# Patient Record
Sex: Male | Born: 1960 | Race: White | Hispanic: No | Marital: Married | Smoking: Former smoker
Health system: Southern US, Community
[De-identification: ages and names within clinical notes are randomized; demographics above are authoritative.]

## PROBLEM LIST (undated history)

## (undated) DIAGNOSIS — I10 Essential (primary) hypertension: Secondary | ICD-10-CM

## (undated) DIAGNOSIS — E079 Disorder of thyroid, unspecified: Secondary | ICD-10-CM

## (undated) HISTORY — PX: CHOLECYSTECTOMY: SHX55

## (undated) HISTORY — PX: APPENDECTOMY: SHX54

## (undated) HISTORY — PX: TONSILLECTOMY: SUR1361

---

## 2020-07-19 DIAGNOSIS — R0602 Shortness of breath: Secondary | ICD-10-CM | POA: Insufficient documentation

## 2021-04-12 ENCOUNTER — Ambulatory Visit
Admission: RE | Admit: 2021-04-12 | Discharge: 2021-04-12 | Disposition: A | Discharge status: Home / Self Care | Payer: 59 | Source: Ambulatory Visit

## 2021-04-12 ENCOUNTER — Other Ambulatory Visit: Payer: Self-pay

## 2021-04-12 VITALS — BP 152/97 | HR 84 | Temp 97.6°F | Resp 18

## 2021-04-12 DIAGNOSIS — I10 Essential (primary) hypertension: Secondary | ICD-10-CM

## 2021-04-12 DIAGNOSIS — R059 Cough, unspecified: Secondary | ICD-10-CM | POA: Diagnosis not present

## 2021-04-12 DIAGNOSIS — R0602 Shortness of breath: Secondary | ICD-10-CM

## 2021-04-12 HISTORY — DX: Essential (primary) hypertension: I10

## 2021-04-12 HISTORY — DX: Disorder of thyroid, unspecified: E07.9

## 2021-04-12 MED ORDER — PROMETHAZINE-DM 6.25-15 MG/5ML PO SYRP: 5.0000 mL | ORAL_SOLUTION | Freq: Every evening | ORAL | 0 refills | Status: DC | PRN

## 2021-04-12 MED ORDER — BENZONATATE 100 MG PO CAPS: 100.0000 mg | ORAL_CAPSULE | Freq: Three times a day (TID) | ORAL | 0 refills | Status: DC | PRN

## 2021-04-12 NOTE — ED Triage Notes (Signed)
Pt presents with cough, chest congestion and SOB x

## 2021-04-12 NOTE — Discharge Instructions (Addendum)
Take the cough medications as directed.  Follow up with your new primary care provider.   Go to the emergency department if you have acute shortness of breath or other concerning symptoms.    Your blood pressure is elevated today at 152/97.  Please have this rechecked by your primary care provider.

## 2021-04-12 NOTE — ED Provider Notes (Signed)
[facility]    CSN: [id number] Arrival date & time: 04/12/21  1151      History   Chief Complaint Chief Complaint  Patient presents with   Cough   Shortness of Breath    HPI Jaime Shepherd is a 61 y.o. male.  Patient presents with nonproductive cough and shortness of breath for several months.  The cough is worse at night and is keeping him awake.  The shortness of breath is worse with exertion.  He denies chest pain, weakness, fever, chills, ear pain, sore throat, or other symptoms.  He had a telehealth visit 2 months ago and was treated with prednisone.  He has also used an albuterol inhaler that he bought over-the-counter in [place].  Patient recently moved here from [place]. Just before moving here, he was started on an ACE-inhibitor for his blood pressure; this medication is not available in the [place] so we are unable to list it in his medication record.  He has an appointment to establish a PCP in 2 weeks.  Former smoker; quit 5 months ago.   The history is provided by the patient and the spouse.   Past Medical History:  Diagnosis Date   Hypertension    Thyroid disease     There are no problems to display for this patient.   Past Surgical History:  Procedure Laterality Date   APPENDECTOMY     CHOLECYSTECTOMY     TONSILLECTOMY         Home Medications    Prior to Admission medications   Medication Sig Start Date End Date Taking? Authorizing Provider  benzonatate (TESSALON) 100 MG capsule Take 1 capsule (100 mg total) by mouth 3 (three) times daily as needed for cough. 04/12/21  Yes Mickie Bailate, Felecia Stanfill H, NP  levothyroxine (SYNTHROID) 100 MCG tablet Take 100 mcg by mouth daily before breakfast.   Yes [provider]  promethazine-dextromethorphan (PROMETHAZINE-DM) 6.25-15 MG/5ML syrup Take 5 mLs by mouth at bedtime as needed for cough. 04/12/21  Yes Mickie Bailate, Aulden Calise H, NP    Family History History reviewed. No pertinent family history.  Social  History Social History   Tobacco Use   Smoking status: Former    Types: Cigarettes   Smokeless tobacco: Never  Vaping Use   Vaping Use: Never used  Substance Use Topics   Alcohol use: Not Currently   Drug use: Not Currently     Allergies   Patient has no allergy information on record.   Review of Systems Review of Systems  Constitutional:  Negative for chills and fever.  HENT:  Negative for ear pain and sore throat.   Respiratory:  Positive for cough and shortness of breath.   Cardiovascular:  Negative for chest pain and palpitations.  All other systems reviewed and are negative.   Physical Exam Triage Vital Signs ED Triage Vitals  Enc Vitals Group     BP      Pulse      Resp      Temp      Temp src      SpO2      Weight      Height      Head Circumference      Peak Flow      Pain Score      Pain Loc      Pain Edu?      Excl. in GC?    No data found.  Updated Vital Signs BP (!) 152/97 (BP  Location: Left Arm)    Pulse 84    Temp 97.6 F (36.4 C) (Oral)    Resp 18    SpO2 94%   Visual Acuity Right Eye Distance:   Left Eye Distance:   Bilateral Distance:    Right Eye Near:   Left Eye Near:    Bilateral Near:     Physical Exam Vitals and nursing note reviewed.  Constitutional:      General: He is not in acute distress.    Appearance: Normal appearance. He is well-developed. He is not ill-appearing.  HENT:     Right Ear: Tympanic membrane normal.     Left Ear: Tympanic membrane normal.     Nose: Nose normal.     Mouth/Throat:     Mouth: Mucous membranes are moist.     Pharynx: Oropharynx is clear.  Cardiovascular:     Rate and Rhythm: Normal rate and regular rhythm.     Heart sounds: Normal heart sounds.  Pulmonary:     Effort: Pulmonary effort is normal. No respiratory distress.     Breath sounds: Normal breath sounds. No wheezing or rhonchi.  Musculoskeletal:     Cervical back: Neck supple.     Right lower leg: No edema.     Left lower  leg: No edema.  Skin:    General: Skin is warm and dry.  Neurological:     Mental Status: He is alert.  Psychiatric:        Mood and Affect: Mood normal.        Behavior: Behavior normal.     UC Treatments / Results  Labs (all labs ordered are listed, but only abnormal results are displayed) Labs Reviewed - No data to display  EKG   Radiology No results found.  Procedures Procedures (including critical care time)  Medications Ordered in UC Medications - No data to display  Initial Impression / Assessment and Plan / UC Course  I have reviewed the triage vital signs and the nursing notes.  Pertinent labs & imaging results that were available during my care of the patient were reviewed by me and considered in my medical decision making (see chart for details).    Cough, shortness of breath, elevated blood pressure with HTN.   Former smoker; quit 5 months ago.  He was recently started on ACE for his blood pressure (medication prescribed in [place]).  He has an appointment to establish a PCP in 2 weeks.  He is in no respiratory distress now. Lungs are clear. O2 sat 94% on room air.  Treating cough with Tessalon Perles and promethazine-DM.  Discussed precautions for drowsiness with promethazine-DM.  ED precautions discussed.  Instructed him to follow up with his new PCP as scheduled in 2 weeks.  He agrees to plan of care.    Final Clinical Impressions(s) / UC Diagnoses   Final diagnoses:  Cough, unspecified type  Shortness of breath  Elevated blood pressure reading in office with diagnosis of hypertension     Discharge Instructions      Take the cough medications as directed.  Follow up with your new primary care provider.   Go to the emergency department if you have acute shortness of breath or other concerning symptoms.    Your blood pressure is elevated today at 152/97.  Please have this rechecked by your primary care provider.          ED Prescriptions      Medication Sig Dispense Auth. Provider  promethazine-dextromethorphan (PROMETHAZINE-DM) 6.25-15 MG/5ML syrup Take 5 mLs by mouth at bedtime as needed for cough. 60 mL Mickie Bail, NP   benzonatate (TESSALON) 100 MG capsule Take 1 capsule (100 mg total) by mouth 3 (three) times daily as needed for cough. 21 capsule Mickie Bail, NP      PDMP not reviewed this encounter.   Mickie Bail, NP 04/12/21 1250

## 2021-05-10 ENCOUNTER — Ambulatory Visit
Admission: RE | Admit: 2021-05-10 | Discharge: 2021-05-10 | Disposition: A | Discharge status: Home / Self Care | Payer: 59 | Source: Ambulatory Visit | Attending: Physician Assistant | Admitting: Physician Assistant

## 2021-05-10 ENCOUNTER — Other Ambulatory Visit: Payer: Self-pay

## 2021-05-10 VITALS — BP 129/85 | HR 93 | Temp 98.8°F | Resp 18

## 2021-05-10 DIAGNOSIS — R053 Chronic cough: Secondary | ICD-10-CM

## 2021-05-10 MED ORDER — PROMETHAZINE-DM 6.25-15 MG/5ML PO SYRP: 5.0000 mL | ORAL_SOLUTION | Freq: Every evening | ORAL | 0 refills | Status: DC | PRN

## 2021-05-10 MED ORDER — BENZONATATE 100 MG PO CAPS: 100.0000 mg | ORAL_CAPSULE | Freq: Three times a day (TID) | ORAL | 0 refills | Status: DC | PRN

## 2021-05-10 NOTE — ED Triage Notes (Signed)
Pt presents with cough x 4-5 days.  ?

## 2021-05-10 NOTE — Discharge Instructions (Signed)
I refilled your Tessalon to be used during the day as well as promethazine DM for cough at night.  This can make you sleepy so do not drive or drink alcohol with it.  Continue antihistamine for cough and allergy symptoms.  Make sure you are drinking plenty fluid.  It is important follow-up with specialist as we discussed for additional testing.  If anything worsens and you develop high fever, worsening cough, chest pain, shortness of breath you need to be seen immediately. ?

## 2021-05-10 NOTE — ED Provider Notes (Signed)
?[facility] ? ? ? ?CSN: [id number] ?Arrival date & time: 05/10/21  1311 ? ? ?  ? ?History   ?Chief Complaint ?Chief Complaint  ?Patient presents with  ? Cough  ? ? ?HPI ?Jaime Shepherd is a 61 y.o. male.  ? ?Patient presents today with a several month history of persistent cough that varies in intensity.  He was seen by our clinic with similar symptoms approximately 1 month ago which point he was prescribed Tessalon and Promethazine DM.  Reports this was very effective and help to manage his symptoms.  He has run out of these medications with worsening cough particularly at night.  States that this makes it so he is unable to sleep and will often have associated gagging/nausea.  He denies any fever, nasal congestion, nausea, vomiting.  Denies formal diagnosis of asthma or COPD.  He has tried inhaler without improvement of symptoms.  He is a former smoker but quit several months ago.  He has tried allergy medication without improvement.  Denies any GERD symptoms.  He has not tried taking ACE inhibitor.  He has seen his primary care provider and is scheduled to undergo spirometry and other testing but was hoping to have a refill of this medication until he is seen by specialist. ? ? ?Past Medical History:  ?Diagnosis Date  ? Hypertension   ? Thyroid disease   ? ? ?There are no problems to display for this patient. ? ? ?Past Surgical History:  ?Procedure Laterality Date  ? APPENDECTOMY    ? CHOLECYSTECTOMY    ? TONSILLECTOMY    ? ? ? ? ? ?Home Medications   ? ?Prior to Admission medications   ?Medication Sig Start Date End Date Taking? Authorizing Provider  ?levothyroxine (SYNTHROID) 100 MCG tablet Take 100 mcg by mouth daily before breakfast.   Yes [provider]  ?benzonatate (TESSALON) 100 MG capsule Take 1 capsule (100 mg total) by mouth 3 (three) times daily as needed for cough. 05/10/21   Maciah Schweigert, Noberto Retort, PA-C  ?promethazine-dextromethorphan (PROMETHAZINE-DM) 6.25-15 MG/5ML syrup Take 5 mLs by  mouth at bedtime as needed for cough. 05/10/21   Janilah Hojnacki, Noberto Retort, PA-C  ? ? ?Family History ?History reviewed. No pertinent family history. ? ?Social History ?Social History  ? ?Tobacco Use  ? Smoking status: Former  ?  Types: Cigarettes  ? Smokeless tobacco: Never  ?Vaping Use  ? Vaping Use: Never used  ?Substance Use Topics  ? Alcohol use: Not Currently  ? Drug use: Not Currently  ? ? ? ?Allergies   ?Patient has no known allergies. ? ? ?Review of Systems ?Review of Systems  ?Constitutional:  Positive for activity change. Negative for appetite change, fatigue and fever.  ?HENT:  Negative for congestion, sinus pressure, sneezing and sore throat.   ?Respiratory:  Positive for cough and shortness of breath.   ?Cardiovascular:  Negative for chest pain.  ?Gastrointestinal:  Negative for abdominal pain, diarrhea, nausea and vomiting.  ?Neurological:  Negative for dizziness, light-headedness and headaches.  ?Psychiatric/Behavioral:  Positive for sleep disturbance.   ? ? ?Physical Exam ?Triage Vital Signs ?ED Triage Vitals [05/10/21 1333]  ?Enc Vitals Group  ?   BP 129/85  ?   Pulse Rate 93  ?   Resp 18  ?   Temp 98.8 ?F (37.1 ?C)  ?   Temp Source Oral  ?   SpO2 94 %  ?   Weight   ?   Height   ?  Head Circumference   ?   Peak Flow   ?   Pain Score   ?   Pain Loc   ?   Pain Edu?   ?   Excl. in GC?   ? ?No data found. ? ?Updated Vital Signs ?BP 129/85 (BP Location: Left Arm)   Pulse 93   Temp 98.8 ?F (37.1 ?C) (Oral)   Resp 18   SpO2 94%  ? ?Visual Acuity ?Right Eye Distance:   ?Left Eye Distance:   ?Bilateral Distance:   ? ?Right Eye Near:   ?Left Eye Near:    ?Bilateral Near:    ? ?Physical Exam ?Vitals reviewed.  ?Constitutional:   ?   General: He is awake.  ?   Appearance: Normal appearance. He is well-developed. He is not ill-appearing.  ?   Comments: Very pleasant male appears stated age in no acute distress sitting comfortably in exam room  ?HENT:  ?   Head: Normocephalic and atraumatic.  ?   Right Ear: External ear  normal.  ?   Left Ear: External ear normal.  ?   Nose: Nose normal.  ?   Mouth/Throat:  ?   Pharynx: Uvula midline. No oropharyngeal exudate or posterior oropharyngeal erythema.  ?Cardiovascular:  ?   Rate and Rhythm: Normal rate and regular rhythm.  ?   Heart sounds: Normal heart sounds, S1 normal and S2 normal. No murmur heard. ?Pulmonary:  ?   Effort: Pulmonary effort is normal. No accessory muscle usage or respiratory distress.  ?   Breath sounds: Normal breath sounds. No stridor. No wheezing, rhonchi or rales.  ?   Comments: Clear to auscultation bilaterally ?Abdominal:  ?   General: Bowel sounds are normal.  ?   Palpations: Abdomen is soft.  ?   Tenderness: There is no abdominal tenderness.  ?Neurological:  ?   Mental Status: He is alert.  ?Psychiatric:     ?   Behavior: Behavior is cooperative.  ? ? ? ?UC Treatments / Results  ?Labs ?(all labs ordered are listed, but only abnormal results are displayed) ?Labs Reviewed - No data to display ? ?EKG ? ? ?Radiology ?No results found. ? ?Procedures ?Procedures (including critical care time) ? ?Medications Ordered in UC ?Medications - No data to display ? ?Initial Impression / Assessment and Plan / UC Course  ?I have reviewed the triage vital signs and the nursing notes. ? ?Pertinent labs & imaging results that were available during my care of the patient were reviewed by me and considered in my medical decision making (see chart for details). ? ?  ? ?Vital signs and physical exam reassuring today; no indication for emergent evaluation or imaging.  No indication for viral testing given patient has been symptomatic for several months.  Refill of Tessalon and Promethazine DM to manage symptoms.  He was encouraged to continue daily antihistamine as well as acid reflux medication if he develops any indigestion.  Discussed with and utility of chest x-ray but given patient's lungs are clear and he is scheduled for specialist testing in the near future this was declined.   Discussed the importance of following up with specialist for further evaluation and management.  Discussed that if symptoms worsen anyway and he develops worsening cough, high fever, chest pain, shortness of breath, weakness he needs to be seen immediately.  Strict return precautions given to which she expressed understanding. ? ?Final Clinical Impressions(s) / UC Diagnoses  ? ?Final diagnoses:  ?Chronic cough  ? ? ? ?  Discharge Instructions   ? ?  ?I refilled your Tessalon to be used during the day as well as promethazine DM for cough at night.  This can make you sleepy so do not drive or drink alcohol with it.  Continue antihistamine for cough and allergy symptoms.  Make sure you are drinking plenty fluid.  It is important follow-up with specialist as we discussed for additional testing.  If anything worsens and you develop high fever, worsening cough, chest pain, shortness of breath you need to be seen immediately. ? ? ? ? ?ED Prescriptions   ? ? Medication Sig Dispense Auth. Provider  ? benzonatate (TESSALON) 100 MG capsule Take 1 capsule (100 mg total) by mouth 3 (three) times daily as needed for cough. 21 capsule Arieonna Medine K, PA-C  ? promethazine-dextromethorphan (PROMETHAZINE-DM) 6.25-15 MG/5ML syrup Take 5 mLs by mouth at bedtime as needed for cough. 118 mL Antonios Ostrow K, PA-C  ? ?  ? ?PDMP not reviewed this encounter. ?  ?Jeani Hawking, PA-C ?05/10/21 1355 ? ?

## 2021-06-05 NOTE — Progress Notes (Signed)
? ?New Patient Office Visit ? ?Subjective:  ?Patient ID: Jaime Shepherd, male    DOB: [birth year]  Age: 61 y.o. MRN: [id number] ? ?CC:  ?Chief Complaint  ?Patient presents with  ? New Patient (Initial Visit)  ? ? ?HPI ?Jaime Shepherd presents to establish. Patient relocated from [place] a few months ago. Patient has a past medical history of hypothyroidism and hypertension. Currently on Idaprex Combi 4/1.25 mg for high blood pressure. Patient reports medication was changed to include a diuretic due to problems with fluid retention which has been stable since being on medication. Previously was just on perindopril which he had been on for several years before it was changed. Takes Levothyroxine 100 mcg which he has been on >5 years. Patient reports having shortness of breath which has progressively worsened, gets short of breath even with taking trash can bins out. Also having a chronic cough. Went to urgent care for evaluation and cough syrup at bedtime has helped. Reports tried Claritin which was ineffective. Has an appointment with pulmonology 06/22/21. No chest pain, jaw or arm pain, dizziness or syncope. Does report some shortness of breath at night when laying down. When feeling short of breath sometimes feels like his heart is racing, does not happen all the time. Denies prior hx of hyperlipidemia or heart diease.  ? ? ?Past Medical History:  ?Diagnosis Date  ? Hypertension   ? Thyroid disease   ? ? ?Past Surgical History:  ?Procedure Laterality Date  ? APPENDECTOMY    ? CHOLECYSTECTOMY    ? TONSILLECTOMY    ? ? ?History reviewed. No pertinent family history. ? ?Social History  ? ?Socioeconomic History  ? Marital status: Married  ?  Spouse name: Not on file  ? Number of children: Not on file  ? Years of education: Not on file  ? Highest education level: Not on file  ?Occupational History  ? Not on file  ?Tobacco Use  ? Smoking status: Former  ?  Packs/day: 0.50  ?  Years: 7.00  ?  Pack years: 3.50  ?   Types: Cigarettes  ?  Quit date: 2022  ?  Years since quitting: 1.2  ? Smokeless tobacco: Never  ?Vaping Use  ? Vaping Use: Never used  ?Substance and Sexual Activity  ? Alcohol use: Not Currently  ? Drug use: Not Currently  ? Sexual activity: Yes  ?Other Topics Concern  ? Not on file  ?Social History Narrative  ? Not on file  ? ?Social Determinants of Health  ? ?Financial Resource Strain: Not on file  ?Food Insecurity: Not on file  ?Transportation Needs: Not on file  ?Physical Activity: Not on file  ?Stress: Not on file  ?Social Connections: Not on file  ?Intimate Partner Violence: Not on file  ? ? ?ROS ?Review of Systems ?Review of Systems:  ?A fourteen system review of systems was performed and found to be positive as per HPI. ? ?Objective:  ? ?Today's Vitals: BP 124/81   Pulse 70   Temp 98.2 ?F (36.8 ?C)   Ht 6\' 2"  (1.88 m)   Wt 261 lb (118.4 kg)   SpO2 94%   BMI 33.51 kg/m?  ? ?Physical Exam ?General:  Well Developed, well nourished, appropriate for stated age.  ?Neuro:  Alert and oriented,  extra-ocular muscles intact  ?HEENT:  Normocephalic, atraumatic, neck supple  ?Skin:  no gross rash, warm, pink. ?Cardiac:  RRR, S1 S2 ?Respiratory: CTA B/L  ?Vascular:  Ext warm, no  cyanosis apprec.; cap RF less 2 sec. ?Psych:  No HI/SI, judgement and insight good, Euthymic mood. Full Affect. ? ?Assessment & Plan:  ? ?Problem List Items Addressed This Visit   ? ?  ? Cardiovascular and Mediastinum  ? Primary hypertension - Primary  ?  -BP and pulse in office optimal. Patient relocated from [place] no have no records to review. Discussed with patient to complete Idaprex Combi 4/1.25 mg and then start Lisinopril-HCTZ 10-12.5 mg. Patient has a chronic cough which could possibly be a side effect of medication therapy with Acei so will consider changing anti-hypertensive if pulmonology work-up negative for underlying pulmonary condition. Patient is former smoker with 3.5 pack yr hx. Will collect CMP to obtin baseline for  renal function and electrolytes. Will continue to monitor. ?  ?  ? Relevant Medications  ? INDAPAMIDE PO  ? lisinopril-hydrochlorothiazide (ZESTORETIC) 10-12.5 MG tablet  ?  ? Endocrine  ? Hypothyroidism  ?  -Recommend to schedule lab visit and will collect TSH. Continue current medication regimen. Pending lab results will adjust treatment plan if indicated. Will continue to monitor. ?  ?  ? Relevant Medications  ? levothyroxine (SYNTHROID) 100 MCG tablet  ?  ? Other  ? Shortness of breath  ?  -Reviewed urgent care notes. No labs have been obtained. Advised patient to schedule lab visit for fasting blood-work to evaluate for cardiovascular risk factors, endocrine or metabolic etiology contributing to symptoms. Will also obtain BNP and place order for echocardiogram to evaluate for possible cardiac etiology. Provided refill of tessalon Perles and Bromfed. Scheduled with [facility] 06/22/21.  ?  ?  ? Relevant Orders  ? ECHOCARDIOGRAM COMPLETE  ? ?Other Visit Diagnoses   ? ? Encounter to establish care      ? ?  ? ? ?Outpatient Encounter Medications as of 06/06/2021  ?Medication Sig  ? INDAPAMIDE PO 1.25 mg.  ? lisinopril-hydrochlorothiazide (ZESTORETIC) 10-12.5 MG tablet Take 1 tablet by mouth daily.  ? [DISCONTINUED] Perindopril Arg-amLODIPine 7-5 MG TABS   ? benzonatate (TESSALON) 100 MG capsule Take 1 capsule (100 mg total) by mouth 3 (three) times daily as needed for cough.  ? levothyroxine (SYNTHROID) 100 MCG tablet Take 1 tablet (100 mcg total) by mouth daily before breakfast.  ? promethazine-dextromethorphan (PROMETHAZINE-DM) 6.25-15 MG/5ML syrup Take 5 mLs by mouth at bedtime as needed for cough.  ? [DISCONTINUED] benzonatate (TESSALON) 100 MG capsule Take 1 capsule (100 mg total) by mouth 3 (three) times daily as needed for cough.  ? [DISCONTINUED] levothyroxine (SYNTHROID) 100 MCG tablet Take 100 mcg by mouth daily before breakfast.  ? [DISCONTINUED] promethazine-dextromethorphan (PROMETHAZINE-DM) 6.25-15 MG/5ML  syrup Take 5 mLs by mouth at bedtime as needed for cough.  ? ?No facility-administered encounter medications on file as of 06/06/2021.  ? ? ?Follow-up: Return in about 8 weeks (around 08/01/2021) for HTN, thyroid, dyspnea; lab visit this week or next for FBW (include BNP) .  ? ?Lorrene Reid, PA-C ? ?

## 2021-06-06 ENCOUNTER — Ambulatory Visit (INDEPENDENT_AMBULATORY_CARE_PROVIDER_SITE_OTHER): Payer: 59 | Admitting: Physician Assistant

## 2021-06-06 ENCOUNTER — Encounter: Payer: Self-pay | Admitting: Physician Assistant

## 2021-06-06 ENCOUNTER — Other Ambulatory Visit: Payer: Self-pay

## 2021-06-06 VITALS — BP 124/81 | HR 70 | Temp 98.2°F | Ht 74.0 in | Wt 261.0 lb

## 2021-06-06 DIAGNOSIS — E0590 Thyrotoxicosis, unspecified without thyrotoxic crisis or storm: Secondary | ICD-10-CM | POA: Insufficient documentation

## 2021-06-06 DIAGNOSIS — I10 Essential (primary) hypertension: Secondary | ICD-10-CM | POA: Diagnosis not present

## 2021-06-06 DIAGNOSIS — Z7689 Persons encountering health services in other specified circumstances: Secondary | ICD-10-CM

## 2021-06-06 DIAGNOSIS — E039 Hypothyroidism, unspecified: Secondary | ICD-10-CM | POA: Diagnosis not present

## 2021-06-06 DIAGNOSIS — R0602 Shortness of breath: Secondary | ICD-10-CM | POA: Diagnosis not present

## 2021-06-06 MED ORDER — BENZONATATE 100 MG PO CAPS: 100.0000 mg | ORAL_CAPSULE | Freq: Three times a day (TID) | ORAL | 0 refills | Status: DC | PRN

## 2021-06-06 MED ORDER — LISINOPRIL-HYDROCHLOROTHIAZIDE 10-12.5 MG PO TABS: 1.0000 | ORAL_TABLET | Freq: Every day | ORAL | 0 refills | Status: DC

## 2021-06-06 MED ORDER — LEVOTHYROXINE SODIUM 100 MCG PO TABS: 100.0000 ug | ORAL_TABLET | Freq: Every day | ORAL | 1 refills | Status: AC

## 2021-06-06 MED ORDER — PROMETHAZINE-DM 6.25-15 MG/5ML PO SYRP: 5.0000 mL | ORAL_SOLUTION | Freq: Every evening | ORAL | 0 refills | Status: DC | PRN

## 2021-06-06 NOTE — Assessment & Plan Note (Signed)
-  Recommend to schedule lab visit and will collect TSH. Continue current medication regimen. Pending lab results will adjust treatment plan if indicated. Will continue to monitor. ?

## 2021-06-06 NOTE — Patient Instructions (Signed)
Shortness of Breath, Adult Shortness of breath is when a person has trouble breathing or when a person feels like she or he is having trouble breathing in enough air. Shortness of breath could be a sign of a medical problem. Follow these instructions at home: Pollutants Do not use any products that contain nicotine or tobacco. These products include cigarettes, chewing tobacco, and vaping devices, such as e-cigarettes. This also includes cigars and pipes. If you need help quitting, ask your health care provider. Avoid things that can irritate your airways, including: Smoke. This includes campfire smoke, forest fire smoke, and secondhand smoke from tobacco products. Do not smoke or allow others to smoke in your home. Mold. Dust. Air pollution. Chemical fumes. Things that can give you an allergic reaction (allergens) if you have allergies. Common allergens include pollen from grasses or trees and animal dander. Keep your living space clean and free of mold and dust. General instructions Pay attention to any changes in your symptoms. Take over-the-counter and prescription medicines only as told by your health care provider. This includes oxygen therapy and inhaled medicines. Rest as needed. Return to your normal activities as told by your health care provider. Ask your health care provider what activities are safe for you. Keep all follow-up visits. This is important. Contact a health care provider if: Your condition does not improve as soon as expected. You have a hard time doing your normal activities, even after you rest. You have new symptoms. You cannot walk up stairs or exercise the way that you normally do. Get help right away if: Your shortness of breath gets worse. You have shortness of breath when you are resting. You feel light-headed or you faint. You have a cough that is not controlled with medicines. You cough up blood. You have pain with breathing. You have pain in your  chest, arms, shoulders, or abdomen. You have a fever. These symptoms may be an emergency. Get help right away. Call 911. Do not wait to see if the symptoms will go away. Do not drive yourself to the hospital. Summary Shortness of breath is when a person has trouble breathing enough air. It can be a sign of a medical problem. Avoid things that irritate your lungs, such as smoking, pollution, mold, and dust. Pay attention to changes in your symptoms and contact your health care provider if you have a hard time completing daily activities because of shortness of breath. This information is not intended to replace advice given to you by your health care provider. Make sure you discuss any questions you have with your health care provider. Document Revised: 10/15/2020 Document Reviewed: 10/15/2020 Elsevier Patient Education  2022 [employer]  

## 2021-06-06 NOTE — Assessment & Plan Note (Addendum)
-  BP and pulse in office optimal. Patient relocated from [place] no have no records to review. Discussed with patient to complete Idaprex Combi 4/1.25 mg and then start Lisinopril-HCTZ 10-12.5 mg. Patient has a chronic cough which could possibly be a side effect of medication therapy with Acei so will consider changing anti-hypertensive if pulmonology work-up negative for underlying pulmonary condition. Patient is former smoker with 3.5 pack yr hx. Will collect CMP to obtin baseline for renal function and electrolytes. Will continue to monitor. ?

## 2021-06-06 NOTE — Assessment & Plan Note (Signed)
-  Reviewed urgent care notes. No labs have been obtained. Advised patient to schedule lab visit for fasting blood-work to evaluate for cardiovascular risk factors, endocrine or metabolic etiology contributing to symptoms. Will also obtain BNP and place order for echocardiogram to evaluate for possible cardiac etiology. Provided refill of tessalon Perles and Bromfed. Scheduled with [facility] 06/22/21.  ?

## 2021-06-07 ENCOUNTER — Other Ambulatory Visit: Payer: Self-pay

## 2021-06-07 DIAGNOSIS — R0602 Shortness of breath: Secondary | ICD-10-CM

## 2021-06-07 DIAGNOSIS — Z130 Encounter for screening for diseases of the blood and blood-forming organs and certain disorders involving the immune mechanism: Secondary | ICD-10-CM

## 2021-06-07 DIAGNOSIS — E039 Hypothyroidism, unspecified: Secondary | ICD-10-CM

## 2021-06-07 DIAGNOSIS — I10 Essential (primary) hypertension: Secondary | ICD-10-CM

## 2021-06-08 ENCOUNTER — Other Ambulatory Visit: Payer: 59

## 2021-06-08 DIAGNOSIS — E039 Hypothyroidism, unspecified: Secondary | ICD-10-CM

## 2021-06-08 DIAGNOSIS — Z130 Encounter for screening for diseases of the blood and blood-forming organs and certain disorders involving the immune mechanism: Secondary | ICD-10-CM

## 2021-06-08 DIAGNOSIS — R0602 Shortness of breath: Secondary | ICD-10-CM

## 2021-06-08 DIAGNOSIS — I10 Essential (primary) hypertension: Secondary | ICD-10-CM

## 2021-06-09 LAB — CBC WITH DIFFERENTIAL/PLATELET
Basophils Absolute: 0.1 10*3/uL (ref 0.0–0.2)
Basos: 1 %
EOS (ABSOLUTE): 0.3 10*3/uL (ref 0.0–0.4)
Eos: 4 %
Hematocrit: 45.5 % (ref 37.5–51.0)
Hemoglobin: 15.9 g/dL (ref 13.0–17.7)
Immature Grans (Abs): 0 10*3/uL (ref 0.0–0.1)
Immature Granulocytes: 0 %
Lymphocytes Absolute: 1.4 10*3/uL (ref 0.7–3.1)
Lymphs: 22 %
MCH: 31.7 pg (ref 26.6–33.0)
MCHC: 34.9 g/dL (ref 31.5–35.7)
MCV: 91 fL (ref 79–97)
Monocytes Absolute: 0.6 10*3/uL (ref 0.1–0.9)
Monocytes: 10 %
Neutrophils Absolute: 4 10*3/uL (ref 1.4–7.0)
Neutrophils: 63 %
Platelets: 190 10*3/uL (ref 150–450)
RBC: 5.02 x10E6/uL (ref 4.14–5.80)
RDW: 13.1 % (ref 11.6–15.4)
WBC: 6.4 10*3/uL (ref 3.4–10.8)

## 2021-06-09 LAB — COMPREHENSIVE METABOLIC PANEL
ALT: 25 IU/L (ref 0–44)
AST: 20 IU/L (ref 0–40)
Albumin/Globulin Ratio: 1.4 (ref 1.2–2.2)
Albumin: 4.2 g/dL (ref 3.8–4.8)
Alkaline Phosphatase: 106 IU/L (ref 44–121)
BUN/Creatinine Ratio: 14 (ref 10–24)
BUN: 21 mg/dL (ref 8–27)
Bilirubin Total: 0.7 mg/dL (ref 0.0–1.2)
CO2: 21 mmol/L (ref 20–29)
Calcium: 9.2 mg/dL (ref 8.6–10.2)
Chloride: 100 mmol/L (ref 96–106)
Creatinine, Ser: 1.46 mg/dL — ABNORMAL HIGH (ref 0.76–1.27)
Globulin, Total: 3 g/dL (ref 1.5–4.5)
Glucose: 102 mg/dL — ABNORMAL HIGH (ref 70–99)
Potassium: 4 mmol/L (ref 3.5–5.2)
Sodium: 140 mmol/L (ref 134–144)
Total Protein: 7.2 g/dL (ref 6.0–8.5)
eGFR: 54 mL/min/{1.73_m2} — ABNORMAL LOW (ref >59)

## 2021-06-09 LAB — BRAIN NATRIURETIC PEPTIDE: BNP: 13.3 pg/mL (ref 0.0–100.0)

## 2021-06-09 LAB — LIPID PANEL
Chol/HDL Ratio: 4.3 ratio (ref 0.0–5.0)
Cholesterol, Total: 172 mg/dL (ref 100–199)
HDL: 40 mg/dL (ref >39)
LDL Chol Calc (NIH): 109 mg/dL — ABNORMAL HIGH (ref 0–99)
Triglycerides: 128 mg/dL (ref 0–149)
VLDL Cholesterol Cal: 23 mg/dL (ref 5–40)

## 2021-06-09 LAB — HEMOGLOBIN A1C
Est. average glucose Bld gHb Est-mCnc: 114 mg/dL
Hgb A1c MFr Bld: 5.6 % (ref 4.8–5.6)

## 2021-06-09 LAB — TSH: TSH: 3.79 u[IU]/mL (ref 0.450–4.500)

## 2021-06-16 ENCOUNTER — Ambulatory Visit (HOSPITAL_COMMUNITY): Payer: 59 | Attending: Cardiology

## 2021-06-16 DIAGNOSIS — R0602 Shortness of breath: Secondary | ICD-10-CM | POA: Diagnosis present

## 2021-06-16 LAB — ECHOCARDIOGRAM COMPLETE
Area-P 1/2: 3.65 cm2
S' Lateral: 2.9 cm

## 2021-06-22 ENCOUNTER — Encounter: Payer: Self-pay | Admitting: Pulmonary Disease

## 2021-06-22 ENCOUNTER — Ambulatory Visit: Payer: 59 | Admitting: Pulmonary Disease

## 2021-06-22 ENCOUNTER — Ambulatory Visit (INDEPENDENT_AMBULATORY_CARE_PROVIDER_SITE_OTHER): Payer: 59

## 2021-06-22 VITALS — BP 118/76 | HR 86 | Temp 97.5°F | Ht 74.0 in | Wt 263.0 lb

## 2021-06-22 DIAGNOSIS — R0602 Shortness of breath: Secondary | ICD-10-CM

## 2021-06-22 MED ORDER — DOXYCYCLINE HYCLATE 100 MG PO TABS: 100.0000 mg | ORAL_TABLET | Freq: Two times a day (BID) | ORAL | 0 refills | Status: DC

## 2021-06-22 MED ORDER — ALBUTEROL SULFATE HFA 108 (90 BASE) MCG/ACT IN AERS: 2.0000 | INHALATION_SPRAY | Freq: Four times a day (QID) | RESPIRATORY_TRACT | 6 refills | Status: DC | PRN

## 2021-06-22 MED ORDER — FLUTICASONE FUROATE-VILANTEROL 200-25 MCG/ACT IN AEPB: 1.0000 | INHALATION_SPRAY | Freq: Every day | RESPIRATORY_TRACT | 2 refills | Status: DC

## 2021-06-22 NOTE — Progress Notes (Signed)
? ?      ?Jaime Shepherd    [id number]    08/14/1960 ? ?Primary Care Physician:Abonza, Kandis Cocking, PA-C ? ?Referring Physician: No referring provider defined for this encounter. ? ?Chief complaint:   ?Patient with a history of cough, shortness of breath, wheezing ? ?HPI: ? ?Concern for asthma ? ?Relocated from [place] in October 2022 ? ?Jaime Shepherd was having shortness of breath and wheezing, decreased activity tolerance from cough and shortness of breath even before his relocation ?-Was able to procure albuterol which can be obtained over-the-counter ? ?Jaime Shepherd does notice improvement in his symptoms with albuterol use ? ?Jaime Shepherd has become less active because Jaime Shepherd gets short of breath easily ? ?Activity tolerance less than 100 yards sometimes ? ?Jaime Shepherd does have hypertension, recently started on medications ?-Diastolic blood pressure was in the 100s before medications were started ? ?Recent echocardiogram shows diastolic dysfunction ? ?Jaime Shepherd is known to snore but no witnessed apneas ? ?Jaime Shepherd is short of breath with usual normal activities ? ?Cough sometimes is worse when Jaime Shepherd is laying down or sometimes in the evening ?Does not have any symptoms suggesting reflux, cold weather makes the coughing worse ? ?Reformed smoker ?-Overall smokes about 8 years ?Had about 30 years when Jaime Shepherd was not smoking at all ? 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 ?Outpatient Encounter Medications as of 06/22/2021  ?Medication Sig  ? benzonatate (TESSALON) 100 MG capsule Take 1 capsule (100 mg total) by mouth 3 (three) times daily as needed for cough.  ? fluticasone furoate-vilanterol (BREO ELLIPTA) 200-25 MCG/ACT AEPB Inhale 1 puff into the lungs daily.  ? INDAPAMIDE PO 1.25 mg.  ? levothyroxine (SYNTHROID) 100 MCG tablet Take 1 tablet (100 mcg total) by mouth daily before breakfast.  ? lisinopril-hydrochlorothiazide (ZESTORETIC) 10-12.5 MG tablet Take 1 tablet by mouth daily.  ? [DISCONTINUED] promethazine-dextromethorphan  (PROMETHAZINE-DM) 6.25-15 MG/5ML syrup Take 5 mLs by mouth at bedtime as needed for cough. (Patient not taking: Reported on 06/22/2021)  ? ?No facility-administered encounter medications on file as of 06/22/2021.  ? ? ?Allergies as of 06/22/2021  ? (No Known Allergies)  ? ? ?Past Medical History:  ?Diagnosis Date  ? Hypertension   ? Thyroid disease   ? ? ?Past Surgical History:  ?Procedure Laterality Date  ? APPENDECTOMY    ? CHOLECYSTECTOMY    ? TONSILLECTOMY    ? ? ?Family History  ?Problem Relation Age of Onset  ? Hypertension Mother   ? Melanoma Father   ? Breast cancer Neg Hx   ? Rheum arthritis Neg Hx   ? Sleep apnea Neg Hx   ? ? ?Social History  ? ?Socioeconomic History  ? Marital status: Married  ?  Spouse name: Not on file  ? Number of children: Not on file  ? Years of education: Not on file  ? Highest education level: Not on file  ?Occupational History  ? Not on file  ?Tobacco Use  ? Smoking status: Former  ?  Packs/day: 0.50  ?  Years: 7.00  ?  Pack years: 3.50  ?  Types: Cigarettes  ?  Quit date: 2022  ?  Years since quitting: 1.2  ? Smokeless tobacco: Never  ?Vaping Use  ? Vaping Use: Never used  ?Substance and Sexual Activity  ? Alcohol use: Not Currently  ? Drug use: Not Currently  ? Sexual activity: Yes  ?Other Topics Concern  ? Not on file  ?Social History Narrative  ? Not on file  ? ?  Social Determinants of Health  ? ?Financial Resource Strain: Not on file  ?Food Insecurity: Not on file  ?Transportation Needs: Not on file  ?Physical Activity: Not on file  ?Stress: Not on file  ?Social Connections: Not on file  ?Intimate Partner Violence: Not on file  ? ? ?Review of Systems  ?Constitutional:  Negative for fatigue.  ?Respiratory:  Positive for cough, shortness of breath and wheezing.   ? ?Vitals:  ? 06/22/21 1107  ?BP: 118/76  ?Pulse: 86  ?Temp: (!) 97.5 ?F (36.4 ?C)  ?SpO2: 96%  ? ? ? ?Physical Exam ?Constitutional:   ?   Appearance: Jaime Shepherd is obese.  ?HENT:  ?   Head: Normocephalic.  ?   Right Ear:  Tympanic membrane normal.  ?   Mouth/Throat:  ?   Mouth: Mucous membranes are moist.  ?Cardiovascular:  ?   Rate and Rhythm: Normal rate and regular rhythm.  ?   Heart sounds: No murmur heard. ?  No friction rub.  ?Pulmonary:  ?   Effort: No respiratory distress.  ?   Breath sounds: No stridor. No wheezing or rhonchi.  ?Musculoskeletal:  ?   Cervical back: No rigidity or tenderness.  ?Neurological:  ?   Mental Status: Jaime Shepherd is alert.  ?Psychiatric:     ?   Mood and Affect: Mood normal.  ? ?Data Reviewed: ?Recent echocardiogram shows normal ejection fraction, diastolic dysfunction ? ?Assessment:  ?Possible asthma ?shortness of breath, wheezing cough-notices benefit with using albuterol ? ? ?Plan/Recommendations: ?Obtain chest x-ray ? ?Schedule for pulmonary function test ? ?Prescription for Breo sent to pharmacy ? ?Jaime Shepherd is a known snorer, no witnessed apneas ?Overall more fatigued than usual, unclear whether this is related to sleep disordered breathing versus just largely uncontrolled asthma at present ? ?Information material regarding obstructive sleep apnea provided to patient.   ? ?[name] MD ?[facility] ?06/22/2021, 11:48 AM ? ?CC: No ref. provider found ? ?Addendum: Chest x-ray reviewed showing haziness at the left base ? ?We will call in prescription for doxycycline to be used for 7 days ? ?Need repeat chest x-ray in 4 weeks/at next visit ?

## 2021-06-22 NOTE — Patient Instructions (Addendum)
Shortness of breath, cough, wheezing ?-Likely related to asthma ? ?We will start you on an inhaler ?Breo 200-1 puff daily ?-Remember to rinse your mouth after use ? ?Use albuterol as needed, albuterol can be used up to 4 times a day as needed ? ?Graded exercise as tolerated ? ?Will schedule you for a breathing study, can be done on the day of next visit ?Obtain a chest x-ray ? ?Weight loss as able ? ?Information regarding obstructive sleep apnea ? ?Living With Sleep Apnea ?Sleep apnea is a condition in which breathing pauses or becomes shallow during sleep. Sleep apnea is most commonly caused by a collapsed or blocked airway. People with sleep apnea usually snore loudly. They may have times when they gasp and stop breathing for 10 seconds or more during sleep. This may happen many times during the night. ?The breaks in breathing also interrupt the deep sleep that you need to feel rested. Even if you do not completely wake up from the gaps in breathing, your sleep may not be restful and you feel tired during the day. You may also have a headache in the morning and low energy during the day, and you may feel anxious or depressed. ?How can sleep apnea affect me? ?Sleep apnea increases your chances of extreme tiredness during the day (daytime fatigue). It can also increase your risk for health conditions, such as: ?Heart attack. ?Stroke. ?Obesity. ?Type 2 diabetes. ?Heart failure. ?Irregular heartbeat. ?High blood pressure. ?If you have daytime fatigue as a result of sleep apnea, you may be more likely to: ?Perform poorly at school or work. ?Fall asleep while driving. ?Have difficulty with attention. ?Develop depression or anxiety. ?Have sexual dysfunction. ?What actions can I take to manage sleep apnea? ?Sleep apnea treatment ? ?If you were given a device to open your airway while you sleep, use it only as told by your health care provider. You may be given: ?An oral appliance. This is a custom-made mouthpiece that  shifts your lower jaw forward. ?A continuous positive airway pressure (CPAP) device. This device blows air through a mask when you breathe out (exhale). ?A nasal expiratory positive airway pressure (EPAP) device. This device has valves that you put into each nostril. ?A bi-level positive airway pressure (BIPAP) device. This device blows air through a mask when you breathe in (inhale) and breathe out (exhale). ?You may need surgery if other treatments do not work for you. ?Sleep habits ?Go to sleep and wake up at the same time every day. This helps set your internal clock (circadian rhythm) for sleeping. ?If you stay up later than usual, such as on weekends, try to get up in the morning within 2 hours of your normal wake time. ?Try to get at least 7-9 hours of sleep each night. ?Stop using a computer, tablet, and mobile phone a few hours before bedtime. ?Do not take long naps during the day. If you nap, limit it to 30 minutes. ?Have a relaxing bedtime routine. Reading or listening to music may relax you and help you sleep. ?Use your bedroom only for sleep. ?Keep your television and computer out of your bedroom. ?Keep your bedroom cool, dark, and quiet. ?Use a supportive mattress and pillows. ?Follow your health care provider's instructions for other changes to sleep habits. ?Nutrition ?Do not eat heavy meals in the evening. ?Do not have caffeine in the later part of the day. The effects of caffeine can last for more than 5 hours. ?Follow your health care provider's  or dietitian's instructions for any diet changes. ?Lifestyle ?  ?Do not drink alcohol before bedtime. Alcohol can cause you to fall asleep at first, but then it can cause you to wake up in the middle of the night and have trouble getting back to sleep. ?Do not use any products that contain nicotine or tobacco. These products include cigarettes, chewing tobacco, and vaping devices, such as e-cigarettes. If you need help quitting, ask your health care  provider. ?Medicines ?Take over-the-counter and prescription medicines only as told by your health care provider. ?Do not use over-the-counter sleep medicine. You can become dependent on this medicine, and it can make sleep apnea worse. ?Do not use medicines, such as sedatives and narcotics, unless told by your health care provider. ?Activity ?Exercise on most days, but avoid exercising in the evening. Exercising near bedtime can interfere with sleeping. ?If possible, spend time outside every day. Natural light helps regulate your circadian rhythm. ?General information ?Lose weight if you need to, and maintain a healthy weight. ?Keep all follow-up visits. This is important. ?If you are having surgery, make sure to tell your health care provider that you have sleep apnea. You may need to bring your device with you. ?Where to find more information ?Learn more about sleep apnea and daytime fatigue from: ?American Sleep Association: sleepassociation.org ?National Sleep Foundation: sleepfoundation.org ?National Heart, Lung, and Blood Institute: [employer].gl ?Summary ?Sleep apnea is a condition in which breathing pauses or becomes shallow during sleep. ?Sleep apnea can cause daytime fatigue and other serious health conditions. ?You may need to wear a device while sleeping to help keep your airway open. ?If you are having surgery, make sure to tell your health care provider that you have sleep apnea. You may need to bring your device with you. ?Making changes to sleep habits, diet, lifestyle, and activity can help you manage sleep apnea. ?This information is not intended to replace advice given to you by your health care provider. Make sure you discuss any questions you have with your health care provider. ?Document Revised: 10/05/2020 Document Reviewed: 02/05/2020 ?Elsevier Patient Education ? 2022 Elsevier Inc. ? ?

## 2021-06-26 ENCOUNTER — Ambulatory Visit: Payer: 59 | Admitting: Podiatry

## 2021-06-26 DIAGNOSIS — L600 Ingrowing nail: Secondary | ICD-10-CM | POA: Diagnosis not present

## 2021-06-26 DIAGNOSIS — L989 Disorder of the skin and subcutaneous tissue, unspecified: Secondary | ICD-10-CM

## 2021-06-26 DIAGNOSIS — B351 Tinea unguium: Secondary | ICD-10-CM | POA: Diagnosis not present

## 2021-06-26 NOTE — Patient Instructions (Signed)
You can use "urea nail gel" on the right big toe ?Start a "biotin" supplement or a vitamin for "hair, skin, and nails" ? ?Soak Instructions ? ? ? ?THE DAY AFTER THE PROCEDURE ? ?Place 1/4 cup of epsom salts in a quart of warm tap water.  Submerge your foot or feet with outer bandage intact for the initial soak; this will allow the bandage to become moist and wet for easy lift off.  Once you remove your bandage, continue to soak in the solution for 20 minutes.  This soak should be done twice a day.  Next, remove your foot or feet from solution, blot dry the affected area and cover.  You may use a band aid large enough to cover the area or use gauze and tape.  Apply other medications to the area as directed by the doctor such as polysporin neosporin. ? ?IF YOUR SKIN BECOMES IRRITATED WHILE USING THESE INSTRUCTIONS, IT IS OKAY TO SWITCH TO  WHITE VINEGAR AND WATER. Or you may use antibacterial soap and water to keep the toe clean ? ?Monitor for any signs/symptoms of infection. Call the office immediately if any occur or go directly to the emergency room. Call with any questions/concerns. ? ?

## 2021-07-01 NOTE — Progress Notes (Signed)
Subjective:  ? ?Patient ID: Jaime Shepherd, male   DOB: 61 y.o.   MRN: [id number]  ? ?HPI ?61 year old male presents the office today for concerns of his left big toenail lifting and states the toenail is "dead".  He states that his daughter noticed some odor coming under the toenail.  No recent treatment.  No swelling redness or any drainage that he reports.  He has no other concerns today. ? ? ?Review of Systems  ?All other systems reviewed and are negative. ? ?Past Medical History:  ?Diagnosis Date  ? Hypertension   ? Thyroid disease   ? ? ?Past Surgical History:  ?Procedure Laterality Date  ? APPENDECTOMY    ? CHOLECYSTECTOMY    ? TONSILLECTOMY    ? ? ? ?Current Outpatient Medications:  ?  albuterol (VENTOLIN HFA) 108 (90 Base) MCG/ACT inhaler, Inhale 2 puffs into the lungs every 6 (six) hours as needed for wheezing or shortness of breath., Disp: 8 g, Rfl: 6 ?  benzonatate (TESSALON) 100 MG capsule, Take 1 capsule (100 mg total) by mouth 3 (three) times daily as needed for cough., Disp: 21 capsule, Rfl: 0 ?  doxycycline (VIBRA-TABS) 100 MG tablet, Take 1 tablet (100 mg total) by mouth 2 (two) times daily., Disp: 14 tablet, Rfl: 0 ?  fluticasone furoate-vilanterol (BREO ELLIPTA) 200-25 MCG/ACT AEPB, Inhale 1 puff into the lungs daily., Disp: 60 each, Rfl: 2 ?  INDAPAMIDE PO, 1.25 mg., Disp: , Rfl:  ?  levothyroxine (SYNTHROID) 100 MCG tablet, Take 1 tablet (100 mcg total) by mouth daily before breakfast., Disp: 90 tablet, Rfl: 1 ?  lisinopril-hydrochlorothiazide (ZESTORETIC) 10-12.5 MG tablet, Take 1 tablet by mouth daily., Disp: 90 tablet, Rfl: 0 ? ?No Known Allergies ? ? ? ?   ?Objective:  ?Physical Exam  ?General: AAO x3, NAD ? ?Dermatological: Left hallux toenail is loose with underlying nailbed and there is odor coming from underneath the toenail.  Ingrowing of the nail borders as well in both the medial lateral side.  The nail is hypertrophic, dystrophic with yellow discoloration.  Some dried blood is  noted under the toenail as well.  Appears to have some clearing on the proximal nail fold and he states that it has grown out some since he noticed this started.  No edema no erythema.  I did notice the left foot there is a skin lesion which he has not noticed. ? ? ? ? ?Vascular: Dorsalis Pedis artery and Posterior Tibial artery pedal pulses are 2/4 bilateral with immedate capillary fill time.  There is no pain with calf compression, swelling, warmth, erythema.  ? ?Neruologic: Grossly intact via light touch bilateral. ? ?Musculoskeletal: No gross boney pedal deformities bilateral. No pain, crepitus, or limitation noted with foot and ankle range of motion bilateral. Muscular strength 5/5 in all groups tested bilateral. ? ?Gait: Unassisted, Nonantalgic.  ? ? ?   ?Assessment:  ? ?Left hallux onychomycosis, skin lesion ? ?   ?Plan:  ?-Treatment options discussed including all alternatives, risks, and complications ?-Etiology of symptoms were discussed ?-For the left hallux of the nail is lifting and has some odor from it which is likely buildup of macerated tissue underneath the nail I recommended nail removal.  We discussed the procedure as well as postoperative course he understands and wishes to proceed and consent was signed.  Discussed risks including infection, delayed or nonhealing as well as risk of further nail issues.  Skin skin with alcohol and 3 cc of lidocaine, Marcaine  plain was infiltrated in a digital block fashion.  Once anesthetized a tourniquet was applied.  The skin was then prepped with Betadine.  I did remove the nail and total any complications.  The underlying nailbed appeared to be intact and there is no purulence noted but there was quite a bit of buildup of underneath the toenail which I cleaned today.  Debrided to healthy, bleeding tissue.  I irrigated with alcohol.  Silvadene was applied followed by dressing.  Tourniquet was released and immediate cap refill time was noted.  He tolerated  procedure well any complications. ?-I discussed biopsy of the left lesion.  We will plan on doing this next appointment as I did not want to do it in case of infection with the nail removal. ? ?Vivi Barrack DPM ? ?   ? ?

## 2021-07-05 DIAGNOSIS — R252 Cramp and spasm: Secondary | ICD-10-CM | POA: Insufficient documentation

## 2021-07-12 ENCOUNTER — Ambulatory Visit (INDEPENDENT_AMBULATORY_CARE_PROVIDER_SITE_OTHER): Payer: 59 | Admitting: Physician Assistant

## 2021-07-12 ENCOUNTER — Encounter: Payer: Self-pay | Admitting: Physician Assistant

## 2021-07-12 VITALS — BP 131/82 | HR 96 | Temp 97.7°F | Ht 72.44 in | Wt 265.0 lb

## 2021-07-12 DIAGNOSIS — R252 Cramp and spasm: Secondary | ICD-10-CM | POA: Diagnosis not present

## 2021-07-12 DIAGNOSIS — I10 Essential (primary) hypertension: Secondary | ICD-10-CM | POA: Diagnosis not present

## 2021-07-12 MED ORDER — AMLODIPINE BESYLATE-VALSARTAN 5-160 MG PO TABS: 1.0000 | ORAL_TABLET | Freq: Every day | ORAL | 2 refills | Status: DC

## 2021-07-12 NOTE — Patient Instructions (Signed)
Muscle Cramps and Spasms Muscle cramps and spasms are when muscles tighten by themselves. They usually get better within minutes. Muscle cramps are painful. They are usually stronger and last longer than muscle spasms. Muscle spasms may or may not be painful. They can last a few seconds or much longer. Cramps and spasms can affect any muscle, but they occur most often in the calf muscles of the leg. They are usually not caused by a serious problem. In many cases, the cause is not known. Some common causes include: Doing more physical work or exercise than your body is ready for. Using the muscles too much (overuse) by repeating certain movements too many times. Staying in a certain position for a long time. Playing a sport or doing an activity without preparing properly. Using bad form or technique while playing a sport or doing an activity. Not having enough water in your body (dehydration). Injury. Side effects of some medicines. Low levels of the salts and minerals in your blood (electrolytes), such as low potassium or calcium. Follow these instructions at home: Managing pain and stiffness     Massage, stretch, and relax the muscle. Do this for many minutes at a time. If told, put heat on tight or tense muscles as often as told by your doctor. Use the heat source that your doctor recommends, such as a moist heat pack or a heating pad. Place a towel between your skin and the heat source. Leave the heat on for 20-30 minutes. Remove the heat if your skin turns bright red. This is very important if you are not able to feel pain, heat, or cold. You may have a greater risk of getting burned. If told, put ice on the affected area. This may help if you are sore or have pain after a cramp or spasm. Put ice in a plastic bag. Place a towel between your skin and the bag. Leave the ice on for 20 minutes, 2-3 times a day. Try taking hot showers or baths to help relax tight muscles. Eating and  drinking Drink enough fluid to keep your pee (urine) pale yellow. Eat a healthy diet to help ensure that your muscles work well. This should include: Fruits and vegetables. Lean protein. Whole grains. Low-fat or nonfat dairy products. General instructions If you are having cramps often, avoid intense exercise for several days. Take over-the-counter and prescription medicines only as told by your doctor. Watch for any changes in your symptoms. Keep all follow-up visits as told by your doctor. This is important. Contact a doctor if: Your cramps or spasms get worse or happen more often. Your cramps or spasms do not get better with time. Summary Muscle cramps and spasms are when muscles tighten by themselves. They usually get better within minutes. Cramps and spasms occur most often in the calf muscles of the leg. Massage, stretch, and relax the muscle. This may help the cramp or spasm go away. Drink enough fluid to keep your pee (urine) pale yellow. This information is not intended to replace advice given to you by your health care provider. Make sure you discuss any questions you have with your health care provider. Document Revised: 09/16/2020 Document Reviewed: 09/16/2020 Elsevier Patient Education  2023 Elsevier Inc.  

## 2021-07-12 NOTE — Progress Notes (Signed)
?  Established patient acute visit ? ? ?Patient: Jaime Shepherd   DOB: [birth year]   61 y.o. Male  MRN: [id number] ?Visit Date: 07/12/2021 ? ?No chief complaint on file. ? ?Subjective  ?  ?HPI  ?Patient presents with c/o muscle cramps at various sites (legs, arms, chest, back) which started around when starting new blood pressure medication (Lisinopril-HCTZ). States cramps happen suddenly, no specific triggers or movements. No chest pain, dizziness or muscle weakness. Reports Memory Dance has helped with shortness of breath and has not been using albuterol inhaler as much as before. States completed course of doxycycline. Continues to have a cough. Trying to stay hydrated. ? ? ? ?Medications: ?Outpatient Medications Prior to Visit  ?Medication Sig  ? albuterol (VENTOLIN HFA) 108 (90 Base) MCG/ACT inhaler Inhale 2 puffs into the lungs every 6 (six) hours as needed for wheezing or shortness of breath.  ? fluticasone furoate-vilanterol (BREO ELLIPTA) 200-25 MCG/ACT AEPB Inhale 1 puff into the lungs daily.  ? INDAPAMIDE PO 1.25 mg.  ? levothyroxine (SYNTHROID) 100 MCG tablet Take 1 tablet (100 mcg total) by mouth daily before breakfast.  ? [DISCONTINUED] lisinopril-hydrochlorothiazide (ZESTORETIC) 10-12.5 MG tablet Take 1 tablet by mouth daily.  ? [DISCONTINUED] benzonatate (TESSALON) 100 MG capsule Take 1 capsule (100 mg total) by mouth 3 (three) times daily as needed for cough. (Patient not taking: Reported on 07/12/2021)  ? [DISCONTINUED] doxycycline (VIBRA-TABS) 100 MG tablet Take 1 tablet (100 mg total) by mouth 2 (two) times daily. (Patient not taking: Reported on 07/12/2021)  ? ?No facility-administered medications prior to visit.  ? ? ?Review of Systems ?Review of Systems:  ?A fourteen system review of systems was performed and found to be positive as per HPI. ? ? ?  Objective  ?  ?BP 131/82   Pulse 96   Temp 97.7 ?F (36.5 ?C)   Ht 6' 0.44" (1.84 m)   Wt 265 lb (120.2 kg)   SpO2 94%   BMI 35.50 kg/m?  ? ? ?Physical  Exam  ?General:  Well Developed, well nourished, appropriate for stated age.  ?Neuro:  Alert and oriented,  extra-ocular muscles intact  ?HEENT:  Normocephalic, atraumatic, neck supple  ?Skin:  no gross rash, warm, pink. ?Cardiac:  RRR, S1 S2 ?Respiratory: CTA B/L  ?Vascular:  Ext warm, no cyanosis apprec.; cap RF less 2 sec. ?MSK: Good ROM, good strength of UE and LE, no muscle atrophy, no tenderness of chest wall or back ?Psych:  No HI/SI, judgement and insight good, Euthymic mood. Full Affect. ? ? ?No results found for any visits on 07/12/21. ? Assessment & Plan  ?  ? ?Etiology unclear. Muscle cramps can be adverse reaction with Lisinopril-HCTZ (2%) and given symptom onset started when he started medication possibly medication side effect. Patient also continues to have a cough so will change blood pressure medication to amlodipine-valsartan 5-160 mg. Advised to continue to monitor BP at home. If starts noticing fluid retention we can resume HCTZ as a prn medication for edema. Will collect labs to evaluate for electrolyte imbalance or nutritional deficiency. Will reassess symptoms and medication therapy at scheduled f/up visit.  ? ? ?Return for as scheduled .  ?   ? ? ? ?Lorrene Reid, PA-C  ?[facility] ?[phone] (phone) ?[phone] (fax) ? ?[facility] ?

## 2021-07-13 LAB — CBC WITH DIFFERENTIAL/PLATELET
Basophils Absolute: 0.1 10*3/uL (ref 0.0–0.2)
Basos: 1 %
EOS (ABSOLUTE): 0.1 10*3/uL (ref 0.0–0.4)
Eos: 2 %
Hematocrit: 44.7 % (ref 37.5–51.0)
Hemoglobin: 15.1 g/dL (ref 13.0–17.7)
Immature Grans (Abs): 0 10*3/uL (ref 0.0–0.1)
Immature Granulocytes: 1 %
Lymphocytes Absolute: 2.6 10*3/uL (ref 0.7–3.1)
Lymphs: 40 %
MCH: 30.3 pg (ref 26.6–33.0)
MCHC: 33.8 g/dL (ref 31.5–35.7)
MCV: 90 fL (ref 79–97)
Monocytes Absolute: 0.8 10*3/uL (ref 0.1–0.9)
Monocytes: 12 %
Neutrophils Absolute: 2.9 10*3/uL (ref 1.4–7.0)
Neutrophils: 44 %
Platelets: 159 10*3/uL (ref 150–450)
RBC: 4.98 x10E6/uL (ref 4.14–5.80)
RDW: 13.3 % (ref 11.6–15.4)
WBC: 6.5 10*3/uL (ref 3.4–10.8)

## 2021-07-13 LAB — COMPREHENSIVE METABOLIC PANEL
ALT: 59 IU/L — ABNORMAL HIGH (ref 0–44)
AST: 39 IU/L (ref 0–40)
Albumin/Globulin Ratio: 1.7 (ref 1.2–2.2)
Albumin: 4.3 g/dL (ref 3.8–4.8)
Alkaline Phosphatase: 103 IU/L (ref 44–121)
BUN/Creatinine Ratio: 11 (ref 10–24)
BUN: 15 mg/dL (ref 8–27)
Bilirubin Total: 0.8 mg/dL (ref 0.0–1.2)
CO2: 22 mmol/L (ref 20–29)
Calcium: 9 mg/dL (ref 8.6–10.2)
Chloride: 103 mmol/L (ref 96–106)
Creatinine, Ser: 1.33 mg/dL — ABNORMAL HIGH (ref 0.76–1.27)
Globulin, Total: 2.6 g/dL (ref 1.5–4.5)
Glucose: 95 mg/dL (ref 70–99)
Potassium: 4.2 mmol/L (ref 3.5–5.2)
Sodium: 139 mmol/L (ref 134–144)
Total Protein: 6.9 g/dL (ref 6.0–8.5)
eGFR: 60 mL/min/{1.73_m2} (ref >59)

## 2021-07-13 LAB — MAGNESIUM: Magnesium: 2.1 mg/dL (ref 1.6–2.3)

## 2021-07-13 LAB — B12 AND FOLATE PANEL
Folate: 11.2 ng/mL (ref >3.0)
Vitamin B-12: 438 pg/mL (ref 232–1245)

## 2021-07-14 ENCOUNTER — Ambulatory Visit: Payer: 59 | Admitting: Podiatry

## 2021-07-14 DIAGNOSIS — L989 Disorder of the skin and subcutaneous tissue, unspecified: Secondary | ICD-10-CM

## 2021-07-18 NOTE — Progress Notes (Signed)
Subjective: ?61 year old male presents the office today for follow evaluation after undergoing left total nail avulsion.  He states that the procedure site is healing well and not having any issues.  No drainage or pus that he reports.  No swelling or redness.  No drainage.  He also presents today for biopsy of the skin lesion on the left foot which she has not noticed. ? ? ?Objective: ?AAO x3, NAD ?DP/PT pulses palpable bilaterally, CRT less than 3 seconds ?Status post total nail avulsion left hallux toenail.  Scabbing is present there is no drainage or pus.  No edema, erythema or signs of infection.  On the lateral aspect the foot along the fifth MPJ is a hyperpigmented skin lesion which is new.  There is an area of darkened discoloration and lighter discoloration.  There is no bleeding or drainage.  No ulceration. ?No open lesions or pre-ulcerative lesions.  ?No pain with calf compression, swelling, warmth, erythema ? ?Assessment: ?Skin lesion left foot; status post total nail removal left hallux ? ?Plan: ?-All treatment options discussed with the patient including all alternatives, risks, complications.  ?-In the procedure standpoint her left hallux is appears to be healing well.  Partial soap and water daily and apply a small amount of antibiotic ointment and a bandage daily.  Monitor for any signs or symptoms of infection. ?-Regards to skin lesion on the left foot I recommended biopsy of the lesion we discussed risks of this and he wishes to proceed and consent was signed.  Skin was cleaned and 1 cc lidocaine with epinephrine was infiltrated under the lesion.  Skin was prepped with Betadine.  I utilized a 4 mm punch biopsy to remove the lesion at the site to pathology.  Irrigated with alcohol.  4-0 nylon was then utilized to suture the lesion.  Betadine was applied followed by dressing.  He continues the pain started tomorrow postprocedure instructions discussed.  Tolerated well. ?-Patient encouraged to call  the office with any questions, concerns, change in symptoms.  ? ?Trula Slade DPM ? ?

## 2021-07-21 ENCOUNTER — Encounter: Payer: Self-pay | Admitting: Podiatry

## 2021-07-21 ENCOUNTER — Ambulatory Visit (INDEPENDENT_AMBULATORY_CARE_PROVIDER_SITE_OTHER): Payer: 59

## 2021-07-21 ENCOUNTER — Other Ambulatory Visit: Payer: Self-pay | Admitting: *Deleted

## 2021-07-21 DIAGNOSIS — R0602 Shortness of breath: Secondary | ICD-10-CM

## 2021-07-28 ENCOUNTER — Ambulatory Visit: Payer: 59 | Admitting: Podiatry

## 2021-07-28 DIAGNOSIS — B351 Tinea unguium: Secondary | ICD-10-CM

## 2021-07-28 DIAGNOSIS — L989 Disorder of the skin and subcutaneous tissue, unspecified: Secondary | ICD-10-CM

## 2021-07-28 NOTE — Progress Notes (Signed)
Subjective: 61 year old male presents the office today for follow evaluation after undergoing left total nail avulsion and for punch biopsy of the left foot.  He states he has been doing well and he has no issues.  No drainage or pus.  No fevers or chills.  No other concerns.  Objective: AAO x3, NAD DP/PT pulses palpable bilaterally, CRT less than 3 seconds Status post total nail avulsion left hallux toenail.  Some scabbing still present but has no pain.  No swelling redness or any drainage.  No signs of infection.  Near the biopsy site has 2 single sutures intact without any drainage or pus.  There is no fluctuation or crepitation.  No obvious signs of infection are noted today. No pain with calf compression, swelling, warmth, erythema  Assessment: Skin lesion left foot; status post total nail removal left hallux  Plan: -All treatment options discussed with the patient including all alternatives, risks, complications.  -I removed the sutures today without any complications.  Antibiotic ointment and bandage applied.  Continue with this for the next couple days.  Monitor for any signs or symptoms of infection.  Also I did review the biopsy results again with him today which revealed a benign lesion.  Monitor for any changes in the future. -For the toenail procedure site appears to be healing well.  Monitor for any signs or symptoms of infection or reoccurrence.  Vivi Barrack DPM

## 2021-08-01 ENCOUNTER — Ambulatory Visit: Payer: 59 | Admitting: Physician Assistant

## 2021-08-04 ENCOUNTER — Ambulatory Visit: Payer: 59 | Admitting: Pulmonary Disease

## 2021-08-04 ENCOUNTER — Ambulatory Visit (INDEPENDENT_AMBULATORY_CARE_PROVIDER_SITE_OTHER): Payer: 59 | Admitting: Pulmonary Disease

## 2021-08-04 ENCOUNTER — Encounter: Payer: Self-pay | Admitting: Pulmonary Disease

## 2021-08-04 VITALS — BP 118/76 | HR 74 | Temp 97.9°F | Ht 73.0 in | Wt 259.0 lb

## 2021-08-04 DIAGNOSIS — R0602 Shortness of breath: Secondary | ICD-10-CM

## 2021-08-04 LAB — PULMONARY FUNCTION TEST
DL/VA % pred: 87 %
DL/VA: 3.62 ml/min/mmHg/L
DLCO cor % pred: 85 %
DLCO cor: 25.69 ml/min/mmHg
DLCO unc % pred: 87 %
DLCO unc: 26.05 ml/min/mmHg
FEF 25-75 Post: 2.91 L/sec
FEF 25-75 Pre: 2.18 L/sec
FEF2575-%Change-Post: 33 %
FEF2575-%Pred-Post: 92 %
FEF2575-%Pred-Pre: 69 %
FEV1-%Change-Post: 8 %
FEV1-%Pred-Post: 84 %
FEV1-%Pred-Pre: 78 %
FEV1-Post: 3.33 L
FEV1-Pre: 3.08 L
FEV1FVC-%Change-Post: 5 %
FEV1FVC-%Pred-Pre: 95 %
FEV6-%Change-Post: 4 %
FEV6-%Pred-Post: 88 %
FEV6-%Pred-Pre: 84 %
FEV6-Post: 4.4 L
FEV6-Pre: 4.21 L
FEV6FVC-%Change-Post: 0 %
FEV6FVC-%Pred-Post: 104 %
FEV6FVC-%Pred-Pre: 103 %
FVC-%Change-Post: 2 %
FVC-%Pred-Post: 84 %
FVC-%Pred-Pre: 82 %
FVC-Post: 4.41 L
FVC-Pre: 4.29 L
Post FEV1/FVC ratio: 76 %
Post FEV6/FVC ratio: 100 %
Pre FEV1/FVC ratio: 72 %
Pre FEV6/FVC Ratio: 99 %
RV % pred: 104 %
RV: 2.6 L
TLC % pred: 92 %
TLC: 7.09 L

## 2021-08-04 MED ORDER — FLUTICASONE-SALMETEROL 250-50 MCG/ACT IN AEPB: 1.0000 | INHALATION_SPRAY | Freq: Two times a day (BID) | RESPIRATORY_TRACT | 6 refills | Status: DC

## 2021-08-04 NOTE — Progress Notes (Signed)
PFT done today. 

## 2021-08-04 NOTE — Progress Notes (Signed)
Jaime Shepherd    [id number]    January 23, 1961  Primary Care Physician:Jaime Shepherd  Referring Physician: Mayer Masker, PA-C [address] [place],  [place] [address]  Chief complaint:   Patient with a history of cough, shortness of breath, wheezing  HPI:  Concern for asthma  Shortness of breath is better Wheezing is better Activity levels improved  Feels the antibiotics definitely helped symptoms significantly  Did not feel Breo helped much  Advair did help previously  Still needing albuterol on a daily basis though  Relocated from [place] in October 2022  He was having shortness of breath and wheezing, decreased activity tolerance from cough and shortness of breath even before his relocation -Was able to procure albuterol which can be obtained over-the-counter  He does have hypertension, recently started on medications -Diastolic blood pressure was in the 100s before medications were started  Recent echocardiogram shows diastolic dysfunction  He is known to snore but no witnessed apneas -Snoring is much better -Able to lay flat in bed  He is short of breath with usual normal activities  Reformed smoker -Overall smokes about 8 years Had about 30 years when he was not smoking at all  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   amLODipine-valsartan (EXFORGE) 5-160 MG tablet Take 1 tablet by mouth daily.   fluticasone furoate-vilanterol (BREO ELLIPTA) 200-25 MCG/ACT AEPB Inhale 1 puff into the lungs daily.   levothyroxine (SYNTHROID) 100 MCG tablet Take 1 tablet (100 mcg total) by mouth daily before breakfast.   [DISCONTINUED] INDAPAMIDE PO 1.25 mg.   No facility-administered encounter  medications on file as of 08/04/2021.    Allergies as of 08/04/2021   (No Known Allergies)    Past Medical History:  Diagnosis Date   Hypertension    Thyroid disease     Past Surgical History:  Procedure Laterality Date   APPENDECTOMY     CHOLECYSTECTOMY     TONSILLECTOMY      Family History  Problem Relation Age of Onset   Hypertension Mother    Melanoma Father    Breast cancer Neg Hx    Rheum arthritis Neg Hx    Sleep apnea Neg Hx     Social History   Socioeconomic History   Marital status: Married    Spouse name: Not on file   Number of children: Not on file   Years of education: Not on file   Highest education level: Not on file  Occupational History   Not on file  Tobacco Use   Smoking status: Former    Packs/day: 0.50    Years: 7.00    Pack years: 3.50    Types: Cigarettes    Quit date: 2022    Years since quitting: 1.3   Smokeless tobacco: Never  Vaping Use   Vaping Use: Never used  Substance and Sexual Activity   Alcohol use: Not Currently   Drug use: Not Currently   Sexual activity: Yes  Other Topics Concern   Not on file  Social History Narrative   Not on file   Social Determinants of Health   Financial Resource Strain: Not on file  Food Insecurity: Not on file  Transportation  Needs: Not on file  Physical Activity: Not on file  Stress: Not on file  Social Connections: Not on file  Intimate Partner Violence: Not on file    Review of Systems  Constitutional:  Negative for fatigue.  Respiratory:  Negative for cough, shortness of breath and wheezing.    Vitals:   08/04/21 1342  BP: 118/76  Pulse: 74  Temp: 97.9 F (36.6 C)  SpO2: 93%     Physical Exam Constitutional:      Appearance: He is obese.  Cardiovascular:     Rate and Rhythm: Normal rate and regular rhythm.     Heart sounds: No murmur heard.   No friction rub.  Pulmonary:     Effort: No respiratory distress.     Breath sounds: No stridor. No wheezing or rhonchi.   Musculoskeletal:     Cervical back: No rigidity or tenderness.  Neurological:     Mental Status: He is alert.  Psychiatric:        Mood and Affect: Mood normal.   Data Reviewed: Recent echocardiogram shows normal ejection fraction, diastolic dysfunction  Pulmonary function test reviewed today showing no obstruction, no significant bronchodilator response, no restriction, normal diffusing capacity  Assessment:   Improved symptoms  Shortness of breath on exertion is much better  Wheezing is much better  Still requires albuterol use daily   Plan/Recommendations:  We will switch to Advair  Albuterol use as needed  Tentative follow-up in 6 months  May consider getting off Advair in about 3 months if symptoms are better controlled  Sleeping and waking up feeling like he is at a good nights rest on most nights Spouse not noticing snoring any longer  [name] MD [facility] 08/04/2021, 1:58 PM  CC: Jaime Masker, PA-C  Addendum: Chest x-ray reviewed showing haziness at the left base  We will call in prescription for doxycycline to be used for 7 days  Need repeat chest x-ray in 4 weeks/at next visit

## 2021-08-04 NOTE — Patient Instructions (Signed)
Tentative appointment in 6 months -Depends on how you are doing at the time  Prescription for Advair sent to pharmacy for you you can try this for about 3 months You may stop for about a week to see whether you are not wheezing significantly any longer  Always make sure you have albuterol inhaler available  Call with significant concerns

## 2021-08-04 NOTE — Patient Instructions (Signed)
Managing Your Hypertension Hypertension, also called high blood pressure, is when the force of the blood pressing against the walls of the arteries is too strong. Arteries are blood vessels that carry blood from your heart throughout your body. Hypertension forces the heart to work harder to pump blood and may cause the arteries to become narrow or stiff. Understanding blood pressure readings A blood pressure reading includes a higher number over a lower number: The first, or top, number is called the systolic pressure. It is a measure of the pressure in your arteries as your heart beats. The second, or bottom number, is called the diastolic pressure. It is a measure of the pressure in your arteries as the heart relaxes. For most people, a normal blood pressure is below 120/80. Your personal target blood pressure may vary depending on your medical conditions, your age, and other factors. Blood pressure is classified into four stages. Based on your blood pressure reading, your health care provider may use the following stages to determine what type of treatment you need, if any. Systolic pressure and diastolic pressure are measured in a unit called millimeters of mercury (mmHg). Normal Systolic pressure: below 120. Diastolic pressure: below 80. Elevated Systolic pressure: 120-129. Diastolic pressure: below 80. Hypertension stage 1 Systolic pressure: 130-139. Diastolic pressure: 80-89. Hypertension stage 2 Systolic pressure: 140 or above. Diastolic pressure: 90 or above. How can this condition affect me? Managing your hypertension is very important. Over time, hypertension can damage the arteries and decrease blood flow to parts of the body, including the brain, heart, and kidneys. Having untreated or uncontrolled hypertension can lead to: A heart attack. A stroke. A weakened blood vessel (aneurysm). Heart failure. Kidney damage. Eye damage. Memory and concentration problems. Vascular  dementia. What actions can I take to manage this condition? Hypertension can be managed by making lifestyle changes and possibly by taking medicines. Your health care provider will help you make a plan to bring your blood pressure within a normal range. You may be referred for counseling on a healthy diet and physical activity. Nutrition  Eat a diet that is high in fiber and potassium, and low in salt (sodium), added sugar, and fat. An example eating plan is called the DASH diet. DASH stands for Dietary Approaches to Stop Hypertension. To eat this way: Eat plenty of fresh fruits and vegetables. Try to fill one-half of your plate at each meal with fruits and vegetables. Eat whole grains, such as whole-wheat pasta, brown rice, or whole-grain bread. Fill about one-fourth of your plate with whole grains. Eat low-fat dairy products. Avoid fatty cuts of meat, processed or cured meats, and poultry with skin. Fill about one-fourth of your plate with lean proteins such as fish, chicken without skin, beans, eggs, and tofu. Avoid pre-made and processed foods. These tend to be higher in sodium, added sugar, and fat. Reduce your daily sodium intake. Many people with hypertension should eat less than 1,500 mg of sodium a day. Lifestyle  Work with your health care provider to maintain a healthy body weight or to lose weight. Ask what an ideal weight is for you. Get at least 30 minutes of exercise that causes your heart to beat faster (aerobic exercise) most days of the week. Activities may include walking, swimming, or biking. Include exercise to strengthen your muscles (resistance exercise), such as weight lifting, as part of your weekly exercise routine. Try to do these types of exercises for 30 minutes at least 3 days a week. Do   not use any products that contain nicotine or tobacco. These products include cigarettes, chewing tobacco, and vaping devices, such as e-cigarettes. If you need help quitting, ask your  health care provider. Control any long-term (chronic) conditions you have, such as high cholesterol or diabetes. Identify your sources of stress and find ways to manage stress. This may include meditation, deep breathing, or making time for fun activities. Alcohol use Do not drink alcohol if: Your health care provider tells you not to drink. You are pregnant, may be pregnant, or are planning to become pregnant. If you drink alcohol: Limit how much you have to: 0-1 drink a day for women. 0-2 drinks a day for men. Know how much alcohol is in your drink. In the [place] one drink equals one 12 oz bottle of beer (355 mL), one 5 oz glass of wine (148 mL), or one 1 oz glass of hard liquor (44 mL). Medicines Your health care provider may prescribe medicine if lifestyle changes are not enough to get your blood pressure under control and if: Your systolic blood pressure is 130 or higher. Your diastolic blood pressure is 80 or higher. Take medicines only as told by your health care provider. Follow the directions carefully. Blood pressure medicines must be taken as told by your health care provider. The medicine does not work as well when you skip doses. Skipping doses also puts you at risk for problems. Monitoring Before you monitor your blood pressure: Do not smoke, drink caffeinated beverages, or exercise within 30 minutes before taking a measurement. Use the bathroom and empty your bladder (urinate). Sit quietly for at least 5 minutes before taking measurements. Monitor your blood pressure at home as told by your health care provider. To do this: Sit with your back straight and supported. Place your feet flat on the floor. Do not cross your legs. Support your arm on a flat surface, such as a table. Make sure your upper arm is at heart level. Each time you measure, take two or three readings one minute apart and record the results. You may also need to have your blood pressure checked regularly by  your health care provider. General information Talk with your health care provider about your diet, exercise habits, and other lifestyle factors that may be contributing to hypertension. Review all the medicines you take with your health care provider because there may be side effects or interactions. Keep all follow-up visits. Your health care provider can help you create and adjust your plan for managing your high blood pressure. Where to find more information National Heart, Lung, and Blood Institute: www.nhlbi.nih.gov American Heart Association: www.heart.org Contact a health care provider if: You think you are having a reaction to medicines you have taken. You have repeated (recurrent) headaches. You feel dizzy. You have swelling in your ankles. You have trouble with your vision. Get help right away if: You develop a severe headache or confusion. You have unusual weakness or numbness, or you feel faint. You have severe pain in your chest or abdomen. You vomit repeatedly. You have trouble breathing. These symptoms may be an emergency. Get help right away. Call 911. Do not wait to see if the symptoms will go away. Do not drive yourself to the hospital. Summary Hypertension is when the force of blood pumping through your arteries is too strong. If this condition is not controlled, it may put you at risk for serious complications. Your personal target blood pressure may vary depending on your medical conditions,   your age, and other factors. For most people, a normal blood pressure is less than 120/80. Hypertension is managed by lifestyle changes, medicines, or both. Lifestyle changes to help manage hypertension include losing weight, eating a healthy, low-sodium diet, exercising more, stopping smoking, and limiting alcohol. This information is not intended to replace advice given to you by your health care provider. Make sure you discuss any questions you have with your health care  provider. Document Revised: 11/10/2020 Document Reviewed: 11/10/2020 Elsevier Patient Education  2023 [employer]  

## 2021-08-08 ENCOUNTER — Encounter: Payer: Self-pay | Admitting: Physician Assistant

## 2021-08-08 ENCOUNTER — Ambulatory Visit (INDEPENDENT_AMBULATORY_CARE_PROVIDER_SITE_OTHER): Payer: 59 | Admitting: Physician Assistant

## 2021-08-08 VITALS — BP 119/71 | HR 80 | Temp 97.7°F | Ht 74.0 in | Wt 260.0 lb

## 2021-08-08 DIAGNOSIS — I10 Essential (primary) hypertension: Secondary | ICD-10-CM

## 2021-08-08 NOTE — Progress Notes (Signed)
Established patient visit   Patient: Jaime Shepherd   DOB: August 23, 1960   61 y.o. Male  MRN: [id number] Visit Date: 08/08/2021  Chief Complaint  Patient presents with   Follow-up   Subjective    HPI  Patient presents for follow-up on hypertension. Patient's blood pressure medication was changed and reports tolerating amlodipine-valsartan without problems. Reports the cough he was experiencing has resolved, is able to sleep at night without interruptions. Also states muscle cramps have improved. BP readings at home have been consistently <130/80. No chest pain, palpitations, dizziness or lower extremity swelling. Reports feeling much better.       Medications: Outpatient Medications Prior to Visit  Medication Sig   albuterol (VENTOLIN HFA) 108 (90 Base) MCG/ACT inhaler Inhale 2 puffs into the lungs every 6 (six) hours as needed for wheezing or shortness of breath.   amLODipine-valsartan (EXFORGE) 5-160 MG tablet Take 1 tablet by mouth daily.   fluticasone-salmeterol (ADVAIR DISKUS) 250-50 MCG/ACT AEPB Inhale 1 puff into the lungs in the morning and at bedtime.   levothyroxine (SYNTHROID) 100 MCG tablet Take 1 tablet (100 mcg total) by mouth daily before breakfast.   No facility-administered medications prior to visit.    Review of Systems Review of Systems:  A fourteen system review of systems was performed and found to be positive as per HPI.   Last CBC Lab Results  Component Value Date   WBC 6.5 07/12/2021   HGB 15.1 07/12/2021   HCT 44.7 07/12/2021   MCV 90 07/12/2021   MCH 30.3 07/12/2021   RDW 13.3 07/12/2021   PLT 159 28/36/6294   Last metabolic panel Lab Results  Component Value Date   GLUCOSE 95 07/12/2021   NA 139 07/12/2021   K 4.2 07/12/2021   CL 103 07/12/2021   CO2 22 07/12/2021   BUN 15 07/12/2021   CREATININE 1.33 (H) 07/12/2021   EGFR 60 07/12/2021   CALCIUM 9.0 07/12/2021   PROT 6.9 07/12/2021   ALBUMIN 4.3 07/12/2021   LABGLOB 2.6  07/12/2021   AGRATIO 1.7 07/12/2021   BILITOT 0.8 07/12/2021   ALKPHOS 103 07/12/2021   AST 39 07/12/2021   ALT 59 (H) 07/12/2021   Last lipids Lab Results  Component Value Date   CHOL 172 06/08/2021   HDL 40 06/08/2021   LDLCALC 109 (H) 06/08/2021   TRIG 128 06/08/2021   CHOLHDL 4.3 06/08/2021   Last hemoglobin A1c Lab Results  Component Value Date   HGBA1C 5.6 06/08/2021   Last thyroid functions Lab Results  Component Value Date   TSH 3.790 06/08/2021   Last vitamin D No results found for: 25OHVITD2, 25OHVITD3, VD25OH   Objective    BP 119/71   Pulse 80   Temp 97.7 F (36.5 C)   Ht 6' 2" (1.88 m)   Wt 260 lb (117.9 kg)   SpO2 95%   BMI 33.38 kg/m  BP Readings from Last 3 Encounters:  08/08/21 119/71  08/04/21 118/76  07/12/21 131/82   Wt Readings from Last 3 Encounters:  08/08/21 260 lb (117.9 kg)  08/04/21 259 lb (117.5 kg)  07/12/21 265 lb (120.2 kg)    Physical Exam  General:  Well Developed, well nourished, appropriate for stated age.  Neuro:  Alert and oriented,  extra-ocular muscles intact  HEENT:  Normocephalic, atraumatic, neck supple  Skin:  no gross rash, warm, pink. Cardiac:  RRR, S1 S2 Respiratory: CTA B/L  Vascular:  Ext warm, no cyanosis apprec.; cap RF less 2  sec. Psych:  No HI/SI, judgement and insight good, Euthymic mood. Full Affect.   No results found for any visits on 08/08/21.  Assessment & Plan      Problem List Items Addressed This Visit       Cardiovascular and Mediastinum   Primary hypertension - Primary   Primary hypertension: -BP has significantly improved and tolerating new blood pressure medication without issues so will continue current medication regimen. Patient's cough has also improved and resolved so advised symptom secondary to medication side effects with previous antihypertensive (ACEi and diuretic). Will continue to monitor.  Return in about 3 months (around 11/08/2021) for HTN.        Lorrene Reid, PA-C  [facility] at [facility] [phone] (phone) [phone] (fax)  [place]

## 2021-10-30 ENCOUNTER — Other Ambulatory Visit: Payer: Self-pay | Admitting: Physician Assistant

## 2021-10-30 DIAGNOSIS — I10 Essential (primary) hypertension: Secondary | ICD-10-CM

## 2021-11-07 NOTE — Progress Notes (Unsigned)
Established patient visit   Patient: Jaime Shepherd   DOB: 1961-03-08   61 y.o. Male  MRN: [id number] Visit Date: 11/08/2021  No chief complaint on file.  Subjective    HPI  Patient presents for chronic follow-up.  HTN: Pt denies chest pain, palpitations, dizziness or leg swelling. Taking medication as directed without side effects. Checks BP at home ***times/wk and readings range in ***. Pt follows a low salt diet.  Hypothyroidism:   Medications: Outpatient Medications Prior to Visit  Medication Sig   albuterol (VENTOLIN HFA) 108 (90 Base) MCG/ACT inhaler Inhale 2 puffs into the lungs every 6 (six) hours as needed for wheezing or shortness of breath.   amLODipine-valsartan (EXFORGE) 5-160 MG tablet TAKE 1 TABLET BY MOUTH DAILY.   fluticasone-salmeterol (ADVAIR DISKUS) 250-50 MCG/ACT AEPB Inhale 1 puff into the lungs in the morning and at bedtime.   levothyroxine (SYNTHROID) 100 MCG tablet Take 1 tablet (100 mcg total) by mouth daily before breakfast.   No facility-administered medications prior to visit.    Review of Systems Review of Systems:  A fourteen system review of systems was performed and found to be positive as per HPI.  Last CBC Lab Results  Component Value Date   WBC 6.5 07/12/2021   HGB 15.1 07/12/2021   HCT 44.7 07/12/2021   MCV 90 07/12/2021   MCH 30.3 07/12/2021   RDW 13.3 07/12/2021   PLT 159 50/53/9767   Last metabolic panel Lab Results  Component Value Date   GLUCOSE 95 07/12/2021   NA 139 07/12/2021   K 4.2 07/12/2021   CL 103 07/12/2021   CO2 22 07/12/2021   BUN 15 07/12/2021   CREATININE 1.33 (H) 07/12/2021   EGFR 60 07/12/2021   CALCIUM 9.0 07/12/2021   PROT 6.9 07/12/2021   ALBUMIN 4.3 07/12/2021   LABGLOB 2.6 07/12/2021   AGRATIO 1.7 07/12/2021   BILITOT 0.8 07/12/2021   ALKPHOS 103 07/12/2021   AST 39 07/12/2021   ALT 59 (H) 07/12/2021   Last lipids Lab Results  Component Value Date   CHOL 172 06/08/2021   HDL 40  06/08/2021   LDLCALC 109 (H) 06/08/2021   TRIG 128 06/08/2021   CHOLHDL 4.3 06/08/2021   Last hemoglobin A1c Lab Results  Component Value Date   HGBA1C 5.6 06/08/2021   Last thyroid functions Lab Results  Component Value Date   TSH 3.790 06/08/2021   Last vitamin D No results found for: "25OHVITD2", "25OHVITD3", "VD25OH" Last vitamin B12 and Folate Lab Results  Component Value Date   VITAMINB12 438 07/12/2021   FOLATE 11.2 07/12/2021       Objective    There were no vitals taken for this visit. BP Readings from Last 3 Encounters:  08/08/21 119/71  08/04/21 118/76  07/12/21 131/82   Wt Readings from Last 3 Encounters:  08/08/21 260 lb (117.9 kg)  08/04/21 259 lb (117.5 kg)  07/12/21 265 lb (120.2 kg)    Physical Exam  General:  Pleasant and cooperative, appropriate for stated age.  Neuro:  Alert and oriented,  extra-ocular muscles intact  HEENT:  Normocephalic, atraumatic, neck supple  Skin:  no gross rash, warm, pink. Cardiac:  RRR, S1 S2 Respiratory: CTA B/L  Vascular:  Ext warm, no cyanosis apprec.; cap RF less 2 sec. Psych:  No HI/SI, judgement and insight good, Euthymic mood. Full Affect.   No results found for any visits on 11/08/21.  Assessment & Plan     *** Problem List Items  Addressed This Visit   None   No follow-ups on file.        Lorrene Reid, PA-C  [facility] at [facility] [phone] (phone) [phone] (fax)  [place]

## 2021-11-08 ENCOUNTER — Encounter: Payer: Self-pay | Admitting: Physician Assistant

## 2021-11-08 ENCOUNTER — Ambulatory Visit (INDEPENDENT_AMBULATORY_CARE_PROVIDER_SITE_OTHER): Payer: 59 | Admitting: Physician Assistant

## 2021-11-08 VITALS — BP 129/77 | HR 94 | Temp 97.7°F | Ht 74.0 in | Wt 256.0 lb

## 2021-11-08 DIAGNOSIS — I10 Essential (primary) hypertension: Secondary | ICD-10-CM | POA: Diagnosis not present

## 2021-11-08 DIAGNOSIS — R195 Other fecal abnormalities: Secondary | ICD-10-CM

## 2021-11-08 DIAGNOSIS — E039 Hypothyroidism, unspecified: Secondary | ICD-10-CM

## 2021-11-08 NOTE — Assessment & Plan Note (Signed)
-  Stable. -Continue current medication regimen. -Will collect CMP for medication monitoring. -Will continue to monitor. 

## 2021-11-08 NOTE — Patient Instructions (Signed)
Exercising to Stay Healthy To become healthy and stay healthy, it is recommended that you do moderate-intensity and vigorous-intensity exercise. You can tell that you are exercising at a moderate intensity if your heart starts beating faster and you start breathing faster but can still hold a conversation. You can tell that you are exercising at a vigorous intensity if you are breathing much harder and faster and cannot hold a conversation while exercising. How can exercise benefit me? Exercising regularly is important. It has many health benefits, such as: Improving overall fitness, flexibility, and endurance. Increasing bone density. Helping with weight control. Decreasing body fat. Increasing muscle strength and endurance. Reducing stress and tension, anxiety, depression, or anger. Improving overall health. What guidelines should I follow while exercising? Before you start a new exercise program, talk with your health care provider. Do not exercise so much that you hurt yourself, feel dizzy, or get very short of breath. Wear comfortable clothes and wear shoes with good support. Drink plenty of water while you exercise to prevent dehydration or heat stroke. Work out until your breathing and your heartbeat get faster (moderate intensity). How often should I exercise? Choose an activity that you enjoy, and set realistic goals. Your health care provider can help you make an activity plan that is individually designed and works best for you. Exercise regularly as told by your health care provider. This may include: Doing strength training two times a week, such as: Lifting weights. Using resistance bands. Push-ups. Sit-ups. Yoga. Doing a certain intensity of exercise for a given amount of time. Choose from these options: A total of 150 minutes of moderate-intensity exercise every week. A total of 75 minutes of vigorous-intensity exercise every week. A mix of moderate-intensity and  vigorous-intensity exercise every week. Children, pregnant women, people who have not exercised regularly, people who are overweight, and older adults may need to talk with a health care provider about what activities are safe to perform. If you have a medical condition, be sure to talk with your health care provider before you start a new exercise program. What are some exercise ideas? Moderate-intensity exercise ideas include: Walking 1 mile (1.6 km) in about 15 minutes. Biking. Hiking. Golfing. Dancing. Water aerobics. Vigorous-intensity exercise ideas include: Walking 4.5 miles (7.2 km) or more in about 1 hour. Jogging or running 5 miles (8 km) in about 1 hour. Biking 10 miles (16.1 km) or more in about 1 hour. Lap swimming. Roller-skating or in-line skating. Cross-country skiing. Vigorous competitive sports, such as football, basketball, and soccer. Jumping rope. Aerobic dancing. What are some everyday activities that can help me get exercise? Yard work, such as: Pushing a lawn mower. Raking and bagging leaves. Washing your car. Pushing a stroller. Shoveling snow. Gardening. Washing windows or floors. How can I be more active in my day-to-day activities? Use stairs instead of an elevator. Take a walk during your lunch break. If you drive, park your car farther away from your work or school. If you take public transportation, get off one stop early and walk the rest of the way. Stand up or walk around during all of your indoor phone calls. Get up, stretch, and walk around every 30 minutes throughout the day. Enjoy exercise with a friend. Support to continue exercising will help you keep a regular routine of activity. Where to find more information You can find more information about exercising to stay healthy from: U.S. Department of Health and Human Services: www.hhs.gov Centers for Disease Control and Prevention (  CDC): www.cdc.gov Summary Exercising regularly is  important. It will improve your overall fitness, flexibility, and endurance. Regular exercise will also improve your overall health. It can help you control your weight, reduce stress, and improve your bone density. Do not exercise so much that you hurt yourself, feel dizzy, or get very short of breath. Before you start a new exercise program, talk with your health care provider. This information is not intended to replace advice given to you by your health care provider. Make sure you discuss any questions you have with your health care provider. Document Revised: 06/24/2020 Document Reviewed: 06/24/2020 Elsevier Patient Education  2023 Elsevier Inc.  

## 2021-11-08 NOTE — Assessment & Plan Note (Signed)
-  Last TSH wnl -Continue current medication regimen. -Rechecking thyroid labs today. Pending results will make medication adjustments if indicated.  

## 2021-11-12 LAB — COMPREHENSIVE METABOLIC PANEL
ALT: 31 IU/L (ref 0–44)
AST: 21 IU/L (ref 0–40)
Albumin/Globulin Ratio: 1.7 (ref 1.2–2.2)
Albumin: 4.5 g/dL (ref 3.9–4.9)
Alkaline Phosphatase: 99 IU/L (ref 44–121)
BUN/Creatinine Ratio: 13 (ref 10–24)
BUN: 16 mg/dL (ref 8–27)
Bilirubin Total: 0.9 mg/dL (ref 0.0–1.2)
CO2: 21 mmol/L (ref 20–29)
Calcium: 9.1 mg/dL (ref 8.6–10.2)
Chloride: 104 mmol/L (ref 96–106)
Creatinine, Ser: 1.24 mg/dL (ref 0.76–1.27)
Globulin, Total: 2.6 g/dL (ref 1.5–4.5)
Glucose: 94 mg/dL (ref 70–99)
Potassium: 4.1 mmol/L (ref 3.5–5.2)
Sodium: 138 mmol/L (ref 134–144)
Total Protein: 7.1 g/dL (ref 6.0–8.5)
eGFR: 65 mL/min/{1.73_m2} (ref >59)

## 2021-11-12 LAB — TSH: TSH: 2.74 u[IU]/mL (ref 0.450–4.500)

## 2021-11-12 LAB — TISSUE TRANSGLUTAMINASE, IGA: Transglutaminase IgA: <2 U/mL (ref 0–3)

## 2021-11-12 LAB — T4, FREE: Free T4: 1.51 ng/dL (ref 0.82–1.77)

## 2021-12-01 ENCOUNTER — Other Ambulatory Visit: Payer: Self-pay | Admitting: Physician Assistant

## 2021-12-01 DIAGNOSIS — I10 Essential (primary) hypertension: Secondary | ICD-10-CM

## 2021-12-05 ENCOUNTER — Encounter: Payer: Self-pay | Admitting: Physician Assistant

## 2021-12-06 ENCOUNTER — Encounter: Payer: Self-pay | Admitting: Physician Assistant

## 2021-12-06 ENCOUNTER — Ambulatory Visit (INDEPENDENT_AMBULATORY_CARE_PROVIDER_SITE_OTHER): Payer: 59 | Admitting: Physician Assistant

## 2021-12-06 VITALS — BP 127/86 | HR 81 | Temp 98.1°F | Ht 74.0 in | Wt 250.0 lb

## 2021-12-06 DIAGNOSIS — J22 Unspecified acute lower respiratory infection: Secondary | ICD-10-CM

## 2021-12-06 MED ORDER — DOXYCYCLINE HYCLATE 100 MG PO TABS: 100.0000 mg | ORAL_TABLET | Freq: Two times a day (BID) | ORAL | 0 refills | Status: AC

## 2021-12-06 MED ORDER — PSEUDOEPH-BROMPHEN-DM 30-2-10 MG/5ML PO SYRP: 5.0000 mL | ORAL_SOLUTION | Freq: Three times a day (TID) | ORAL | 0 refills | Status: AC | PRN

## 2021-12-06 NOTE — Progress Notes (Signed)
  Established patient acute visit   Patient: Jaime Shepherd   DOB: 1960/11/09   61 y.o. Male  MRN: [id number] Visit Date: 12/06/2021  Chief Complaint  Patient presents with   Follow-up    Cough, congestion, lung pain x 1 week     Subjective    HPI HPI     Follow-up    Additional comments: Cough, congestion, lung pain x 1 week        Last edited by Adelfa Koh, CMA on 12/06/2021  1:40 PM.      Patient presents with c/o cough, chest congestion, general malaise and lung pain x 1 week. Patient reports she did have a fever for one day which broke Sunday night. Feels like his phlegm is stuck in his chest, cough is mostly dry. No cold chills, body aches, earache, runny nose or nasal congestion. Does report some chest rattling and shortness of breath with activity. Has tried cough lozenges. Patient does have a prior hx of pneumonia this year.   Medications: Outpatient Medications Prior to Visit  Medication Sig   albuterol (VENTOLIN HFA) 108 (90 Base) MCG/ACT inhaler Inhale 2 puffs into the lungs every 6 (six) hours as needed for wheezing or shortness of breath.   amLODipine-valsartan (EXFORGE) 5-160 MG tablet TAKE 1 TABLET BY MOUTH DAILY.   fluticasone-salmeterol (ADVAIR DISKUS) 250-50 MCG/ACT AEPB Inhale 1 puff into the lungs in the morning and at bedtime.   levothyroxine (SYNTHROID) 100 MCG tablet Take 1 tablet (100 mcg total) by mouth daily before breakfast.   No facility-administered medications prior to visit.    Review of Systems Review of Systems:  A fourteen system review of systems was performed and found to be positive as per HPI.     Objective    BP 127/86   Pulse 81   Temp 98.1 F (36.7 C) (Oral)   Ht 6\' 2"  (1.88 m)   Wt 250 lb (113.4 kg)   BMI 32.10 kg/m    Physical Exam  General:  Cooperative, in no acute distress, appropriate for stated age.  Neuro:  Alert and oriented,  extra-ocular muscles intact  HEENT:  Normocephalic, atraumatic, normal  TM's of both ears, pink and moist nasal mucosa, neck supple, no adenopathy   Skin:  no gross rash, warm, pink. Cardiac:  RRR, S1 S2 Respiratory: Scattered rhonchi, no wheezing, crackles or rales. Vascular:  Ext warm, no cyanosis apprec.; cap RF less 2 sec. Psych:  No HI/SI, judgement and insight good, Euthymic mood. Full Affect.   No results found for any visits on 12/06/21.  Assessment & Plan     Patient presenting with worsening lower respiratory symptoms so will start antibiotic therapy with doxycycline 100 mg BID x 7 days to treat for suspected bacterial infection. Advised patient to also perform a home Covid test to r/o infection and if test were to be positive, recommend to let me know because treatment plan would change to anti-viral treatment. Recommend to take Bromfed as needed for cough/congestion. Continue home supportive care. Follow-up prn.   Return if symptoms worsen or fail to improve.        Lorrene Reid, PA-C  [facility] at [facility] [phone] (phone) [phone] (fax)  [place]

## 2022-01-02 ENCOUNTER — Other Ambulatory Visit: Payer: Self-pay | Admitting: Physician Assistant

## 2022-01-02 DIAGNOSIS — I10 Essential (primary) hypertension: Secondary | ICD-10-CM

## 2022-02-12 ENCOUNTER — Telehealth: Payer: Self-pay | Admitting: *Deleted

## 2022-02-12 ENCOUNTER — Other Ambulatory Visit: Payer: Self-pay | Admitting: Nurse Practitioner

## 2022-02-12 DIAGNOSIS — I10 Essential (primary) hypertension: Secondary | ICD-10-CM

## 2022-02-12 MED ORDER — AMLODIPINE BESYLATE-VALSARTAN 5-160 MG PO TABS: 1.0000 | ORAL_TABLET | Freq: Every day | ORAL | 1 refills | Status: AC

## 2022-02-12 NOTE — Telephone Encounter (Signed)
I filled this and sent to [facility]

## 2022-02-12 NOTE — Telephone Encounter (Signed)
Pt called needing refill of his amlodipine/valsartan sent into pharmacy.  He said he is completely out and will call to schedule appointment in near future. [place] Zimmerman Rumple, CMA

## 2022-02-12 NOTE — Telephone Encounter (Signed)
Patient has been notified that refills were sent to [facility]. All questions and concerns have been addressed.

## 2022-02-13 ENCOUNTER — Telehealth: Payer: Self-pay | Admitting: Pulmonary Disease

## 2022-02-13 ENCOUNTER — Other Ambulatory Visit (HOSPITAL_COMMUNITY): Payer: Self-pay

## 2022-02-13 NOTE — Telephone Encounter (Signed)
Noted  

## 2022-02-13 NOTE — Telephone Encounter (Signed)
Per benefits investigation Advair Diskus is covered, no PA required. Per PA request pharmacy may be using an incorrect NDC.

## 2022-02-13 NOTE — Telephone Encounter (Signed)
Fax received from pt's pharmacy stating that pt's Advair Diskus is requiring a PA. Routing to prior auth team for review.

## 2022-04-18 ENCOUNTER — Other Ambulatory Visit: Payer: Self-pay | Admitting: Nurse Practitioner

## 2022-04-18 DIAGNOSIS — I10 Essential (primary) hypertension: Secondary | ICD-10-CM

## 2022-04-18 NOTE — Telephone Encounter (Signed)
LVM for a returned call.

## 2022-04-23 NOTE — Telephone Encounter (Signed)
Pt came in office and I gave him the below message. He will see if his upcoming appointment with his new provider is soon and he will wait and if not he will call to reschedule an appointment. [address] Rumple, CMA   Vivia Birmingham, RMA  to Rose [facility], [facility]     04/18/22 10:10 AM Please called and schedule this pt for further refill he was a no show for LOV Thank you

## 2022-08-07 ENCOUNTER — Other Ambulatory Visit: Payer: Self-pay | Admitting: Pulmonary Disease

## 2022-10-31 ENCOUNTER — Other Ambulatory Visit: Payer: Self-pay | Admitting: Pulmonary Disease

## 2023-04-03 IMAGING — DX DG CHEST 2V
2 series · 2 of 2 positions shown · non-contrast
Comparison: None.

CLINICAL DATA: 61-year-old male with shortness of breath

EXAM:
CHEST - 2 VIEW

[chest pa]
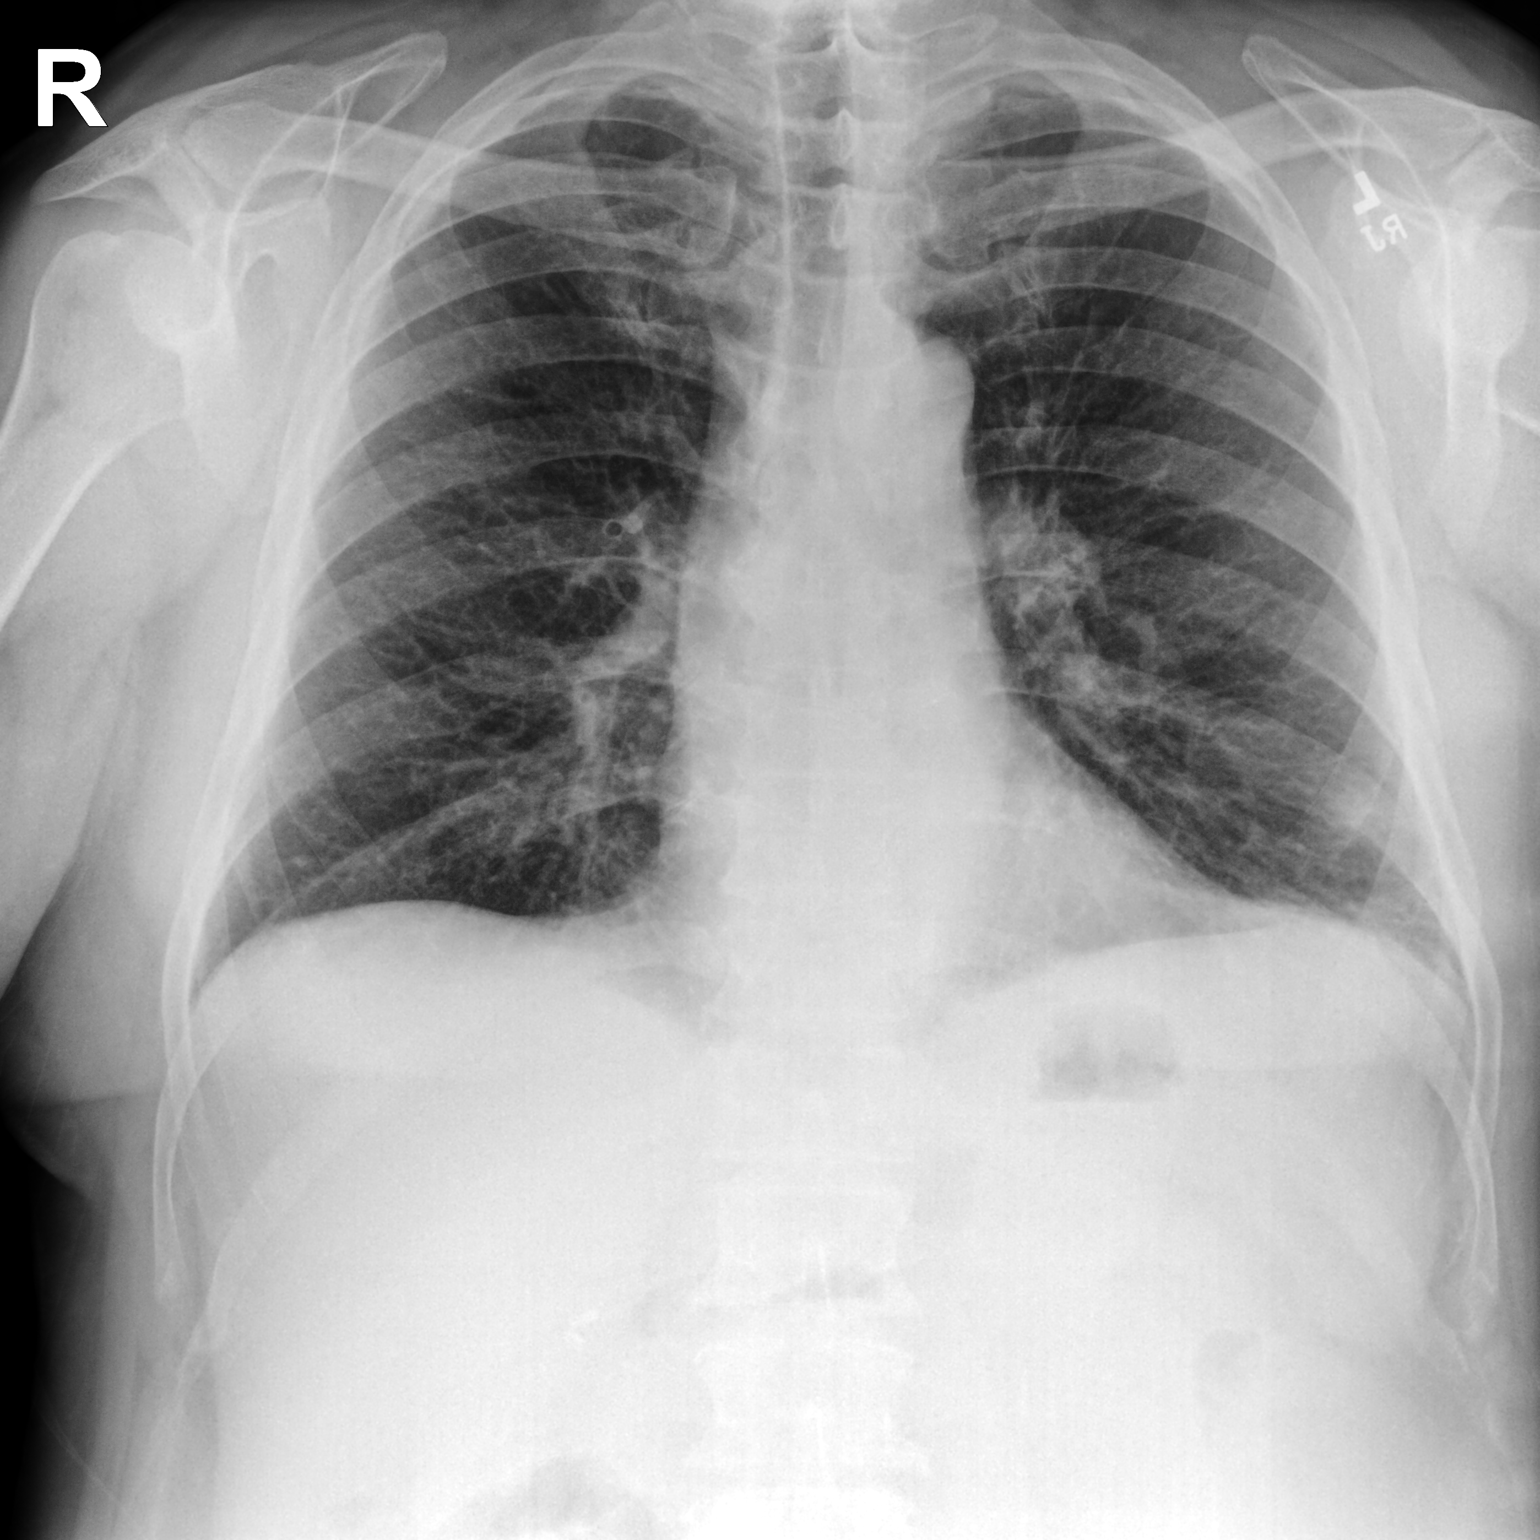

[chest lat]
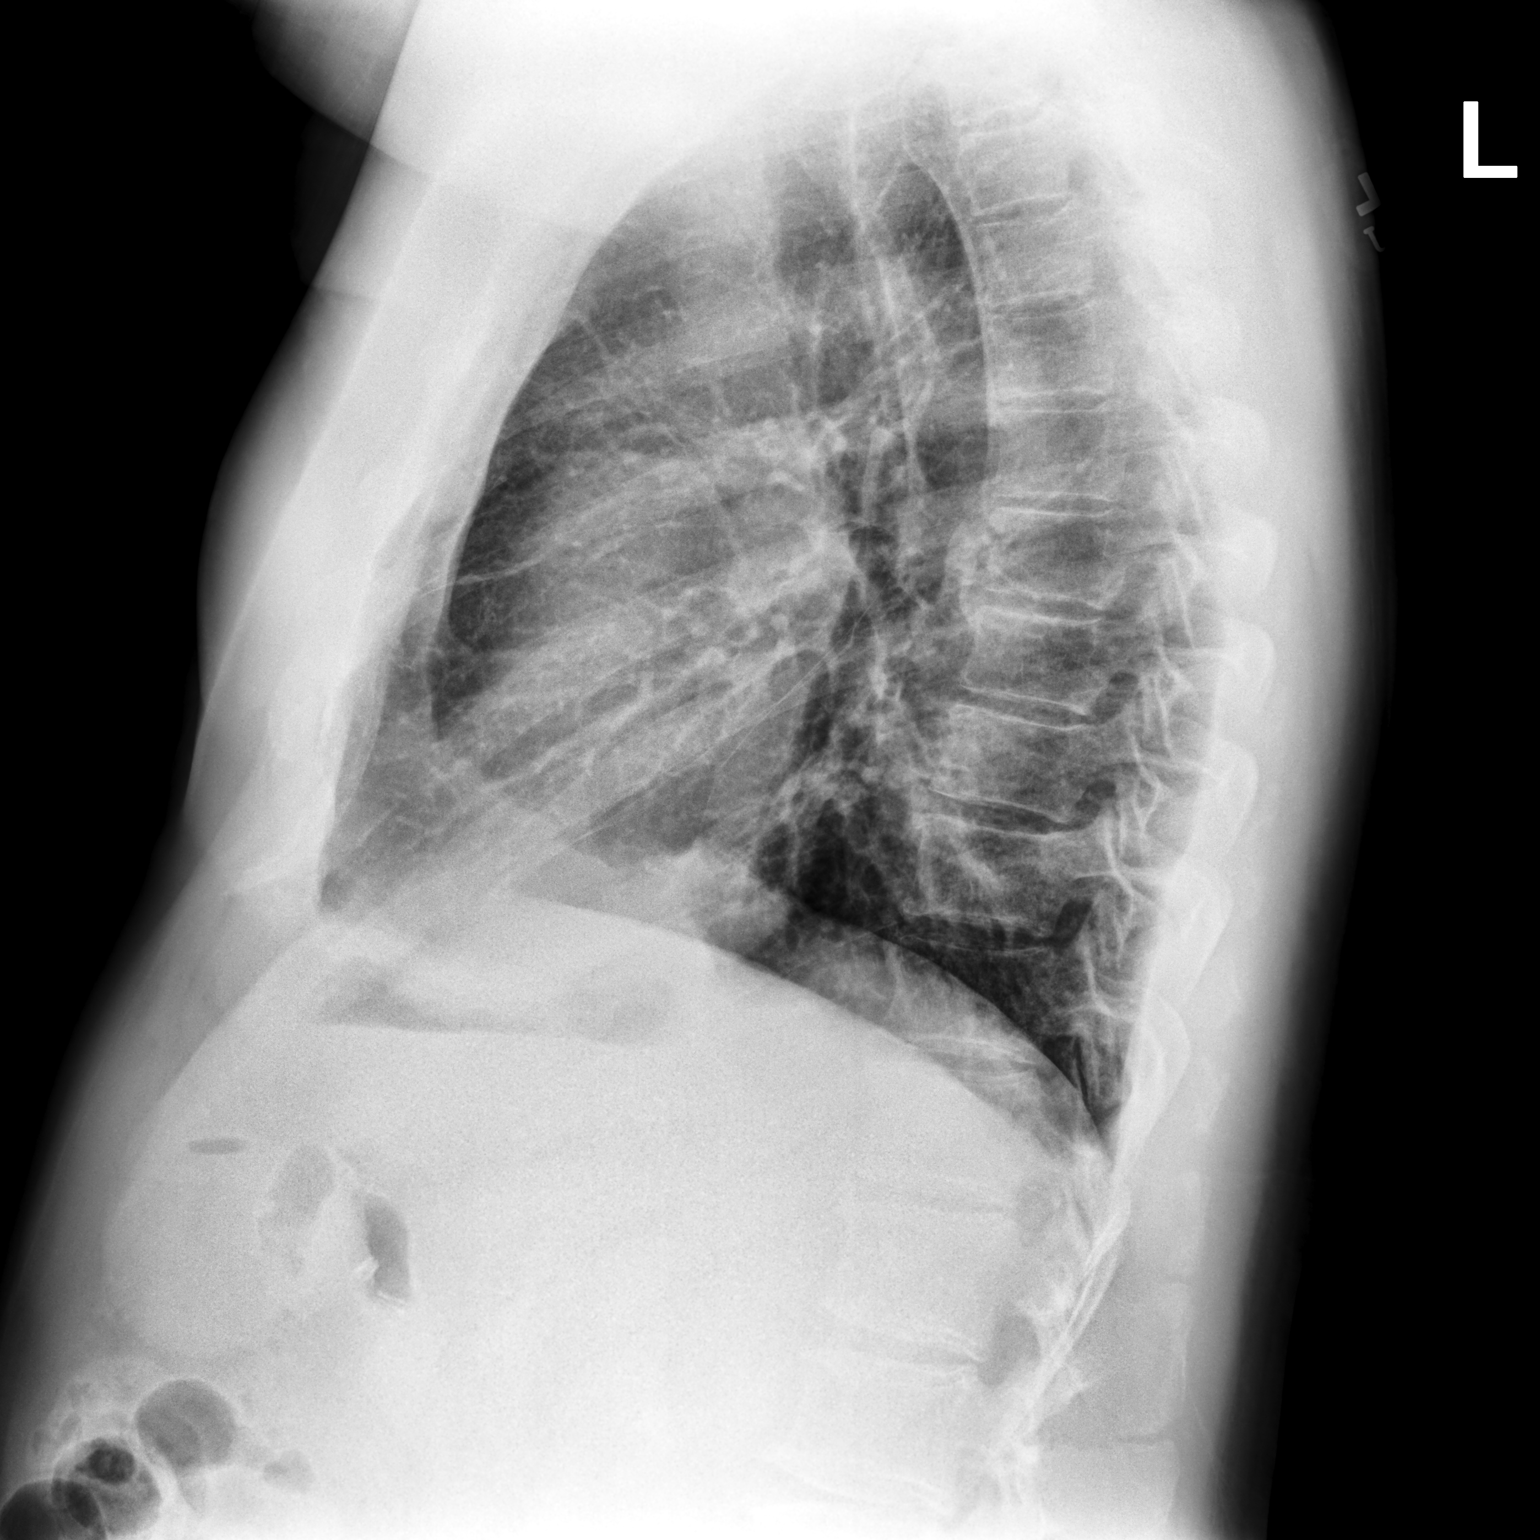

[2 of 2 positions shown; findings below may reference images not displayed]

FINDINGS: Cardiomediastinal silhouette within in size and contour. No evidence
of central vascular congestion. No interlobular septal thickening.

Normal limits patchy airspace opacity at the left lung base. No
comparison.

No pneumothorax or pleural effusion.

No acute displaced fracture. Degenerative changes of the spine.
IMPRESSION: Patchy airspace opacity at the left lung base, potentially
pneumonia. Followup PA and lateral chest X-ray is recommended in 3-4
weeks following therapy to assure resolution

## 2023-05-02 IMAGING — DX DG CHEST 2V
2 series · 2 of 2 positions shown · non-contrast
Comparison: Prior chest x-ray 06/22/2021

CLINICAL DATA: Short of breath follow-up

EXAM:
CHEST - 2 VIEW

[chest pa]
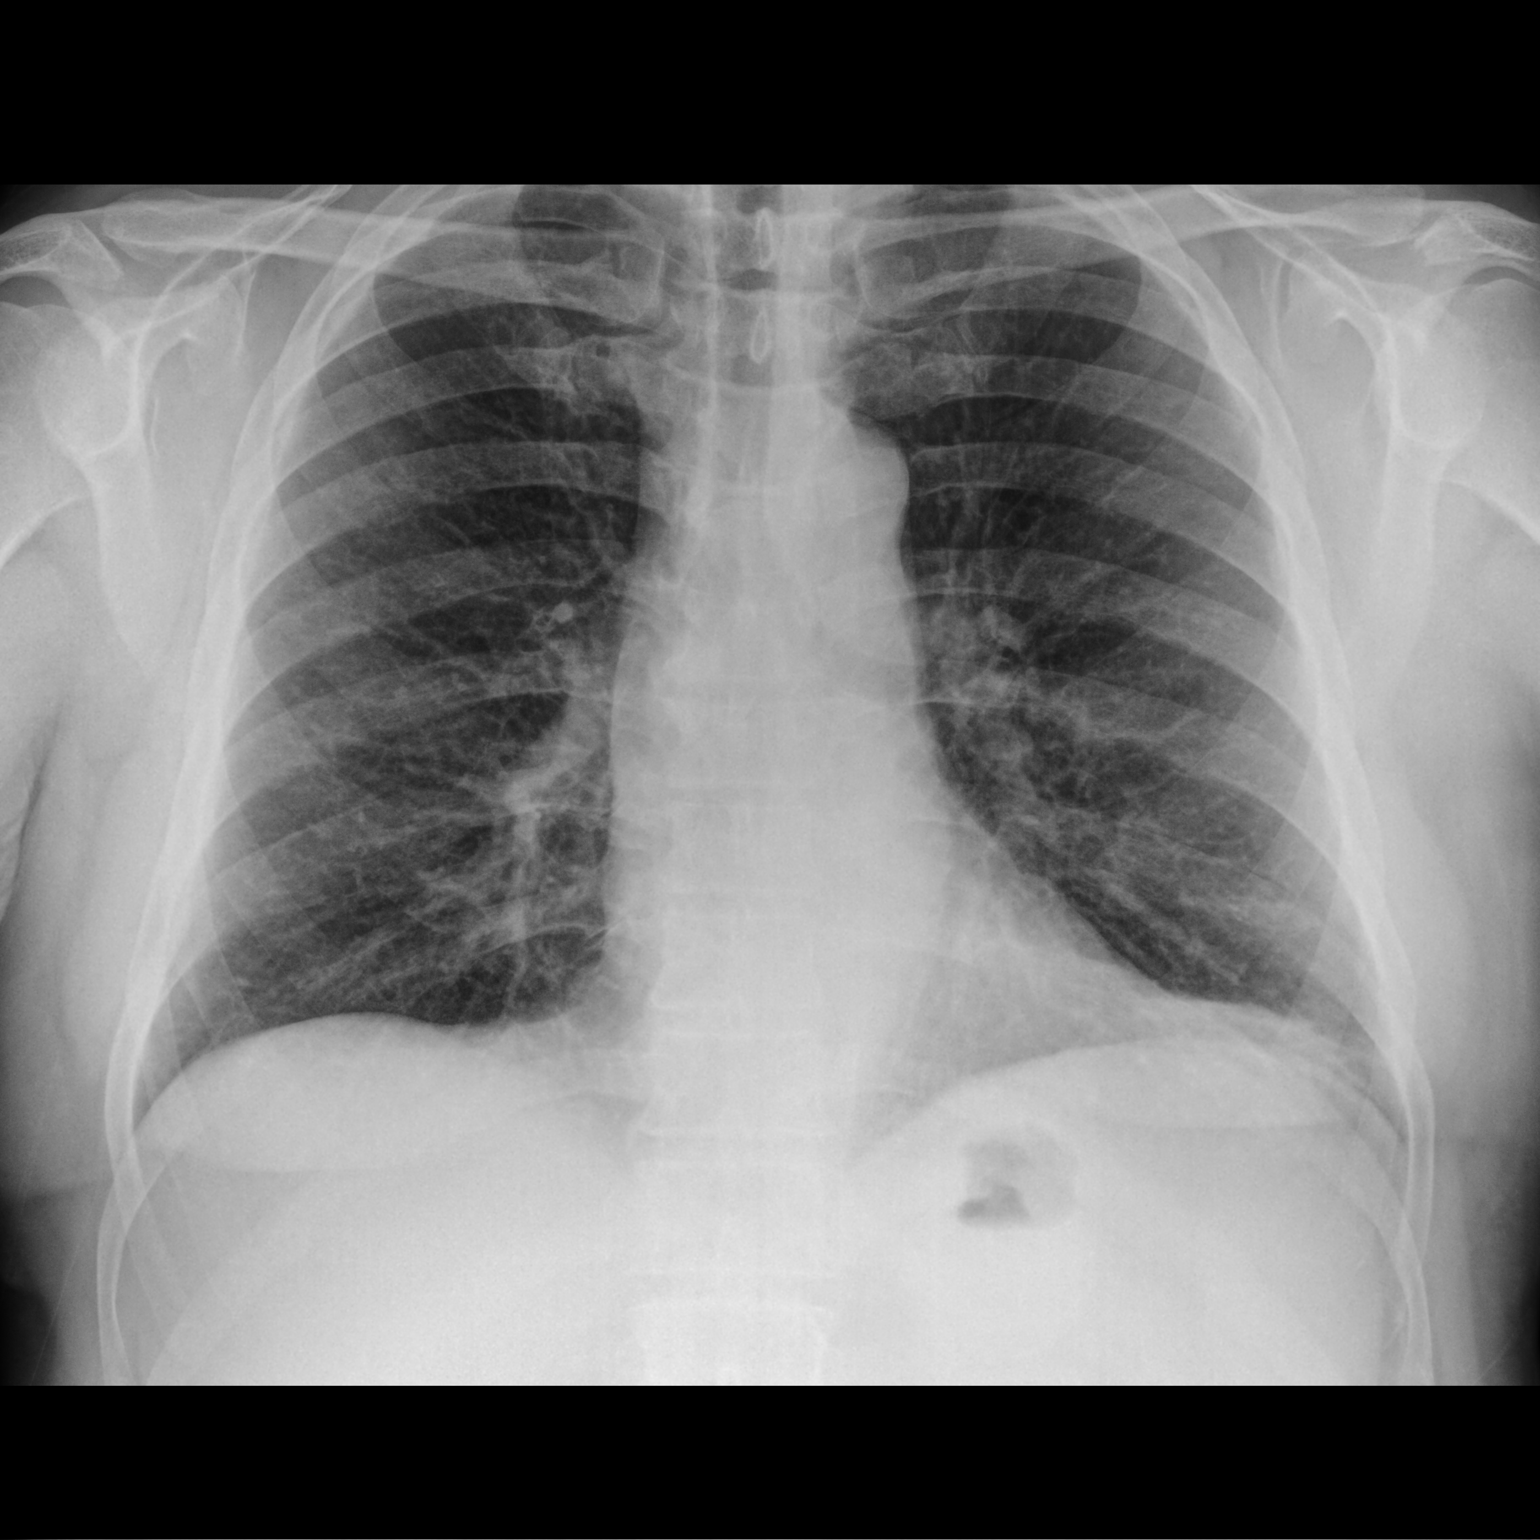

[chest lat]
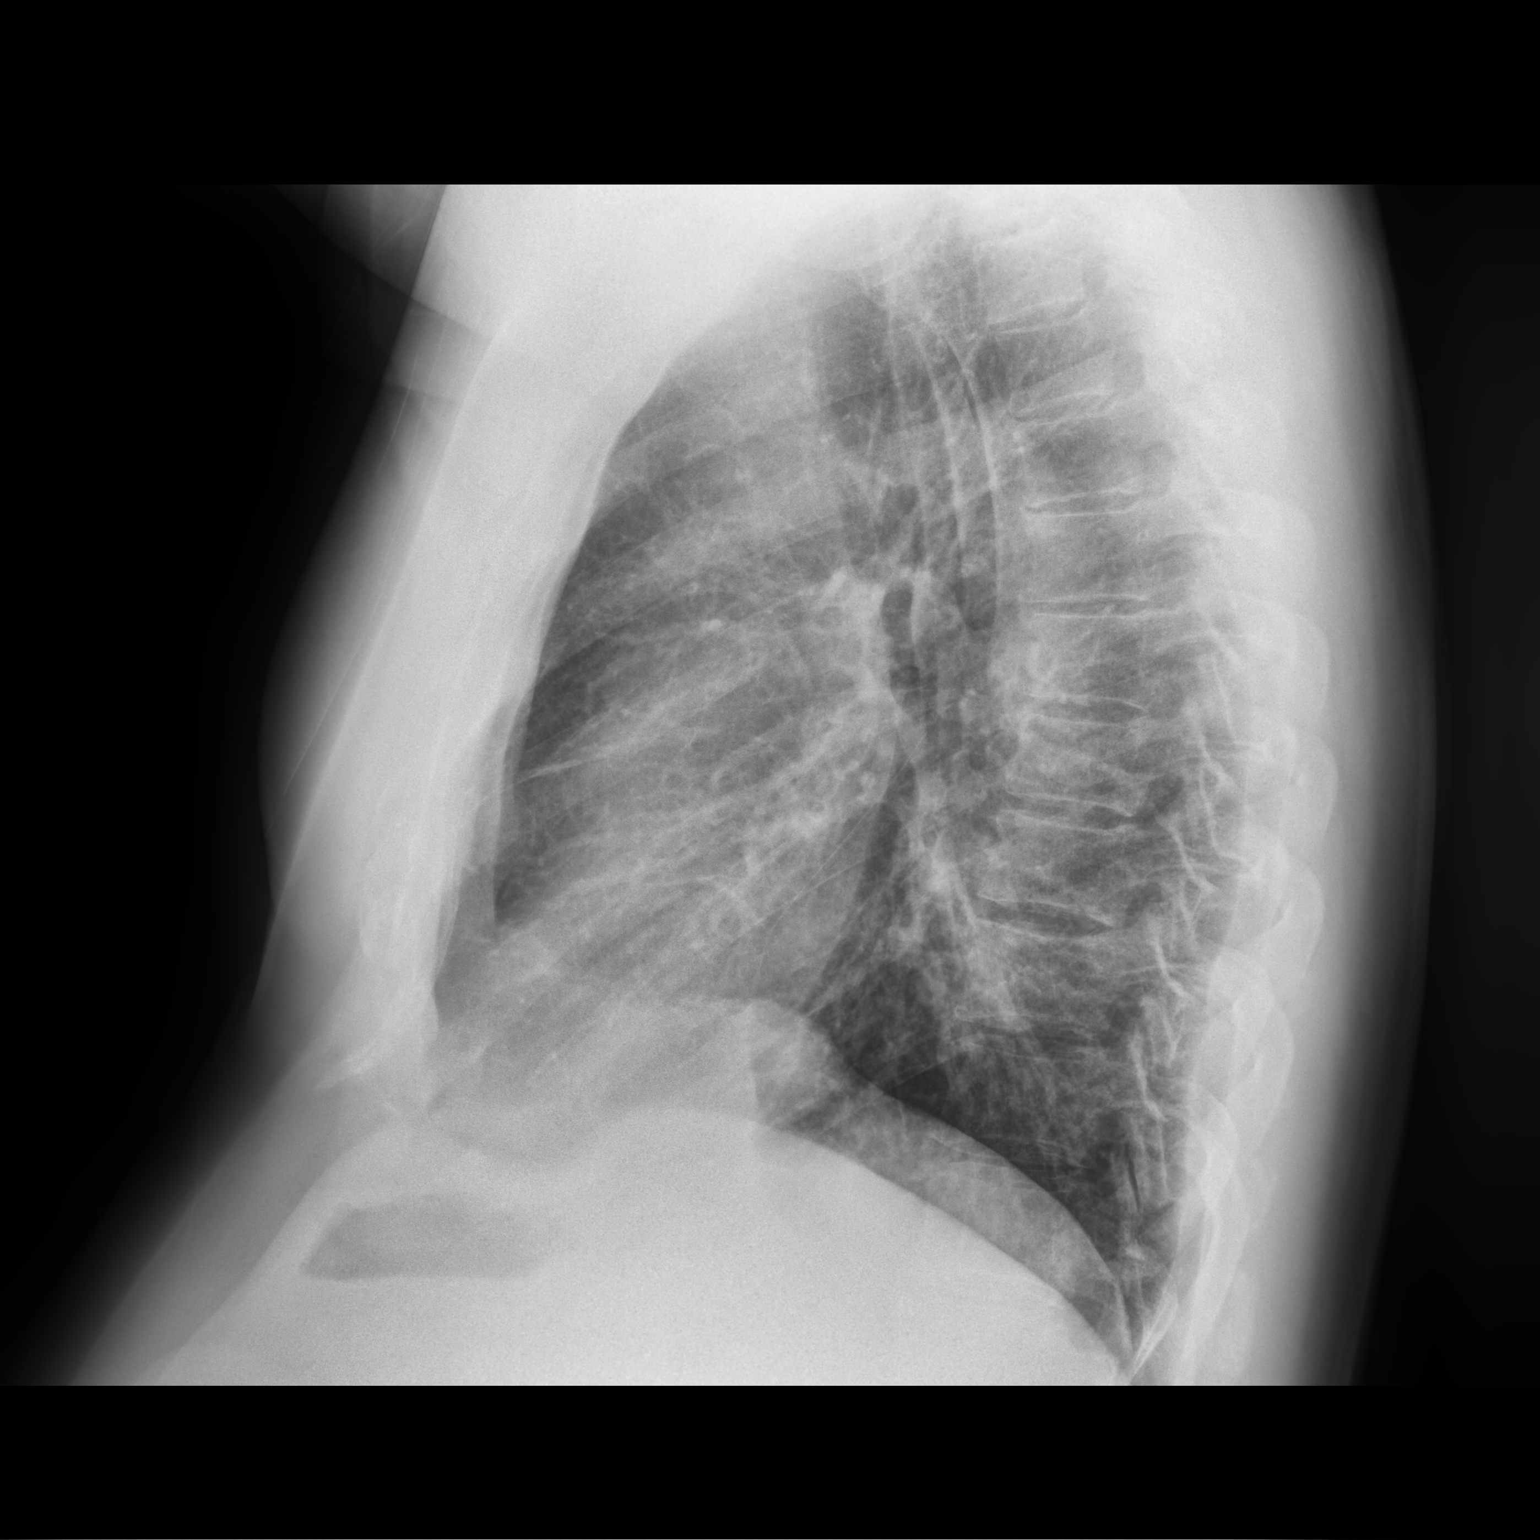

[2 of 2 positions shown; findings below may reference images not displayed]

FINDINGS: Cardiac and mediastinal contours are within normal limits. Resolving
patchy airspace opacity in the lateral periphery of the left lower
lobe. Chronic bronchitic changes and mild interstitial prominence
are similar compared to prior. No acute osseous abnormality.
Surgical clips in the right upper quadrant suggest prior
cholecystectomy.
IMPRESSION: 1. Decreasing conspicuity of patchy airspace opacity in the
periphery of the left lung base consistent with a resolving
infectious/inflammatory process.
2. Chronic bronchitic changes and mild interstitial prominence
appears similar and suggest possible COPD.

## 2023-05-31 LAB — COLOGUARD: COLOGUARD: NEGATIVE

## 2023-08-06 ENCOUNTER — Other Ambulatory Visit: Payer: Self-pay | Admitting: Pulmonary Disease

## 2023-12-03 ENCOUNTER — Other Ambulatory Visit: Payer: Self-pay | Admitting: Pulmonary Disease
